# Patient Record
Sex: Male | Born: 1965 | Race: White | Hispanic: No | Marital: Single | Smoking: Never smoker
Health system: Southern US, Community
[De-identification: ages and names within clinical notes are randomized; demographics above are authoritative.]

## PROBLEM LIST (undated history)

## (undated) DIAGNOSIS — Z9889 Other specified postprocedural states: Secondary | ICD-10-CM

## (undated) DIAGNOSIS — I10 Essential (primary) hypertension: Secondary | ICD-10-CM

## (undated) DIAGNOSIS — R011 Cardiac murmur, unspecified: Secondary | ICD-10-CM

## (undated) DIAGNOSIS — J4530 Mild persistent asthma, uncomplicated: Secondary | ICD-10-CM

## (undated) DIAGNOSIS — F419 Anxiety disorder, unspecified: Secondary | ICD-10-CM

## (undated) DIAGNOSIS — Q249 Congenital malformation of heart, unspecified: Secondary | ICD-10-CM

## (undated) DIAGNOSIS — D689 Coagulation defect, unspecified: Secondary | ICD-10-CM

## (undated) DIAGNOSIS — G4700 Insomnia, unspecified: Secondary | ICD-10-CM

## (undated) HISTORY — PX: BACK SURGERY: SHX140

## (undated) HISTORY — DX: Insomnia, unspecified: G47.00

## (undated) HISTORY — DX: Mild persistent asthma, uncomplicated: J45.30

## (undated) HISTORY — PX: DENTAL RESTORATION/EXTRACTION WITH X-RAY: SHX5796

## (undated) HISTORY — DX: Anxiety disorder, unspecified: F41.9

## (undated) HISTORY — PX: ARTERY REPAIR: SHX559

## (undated) HISTORY — DX: Coagulation defect, unspecified: D68.9

## (undated) HISTORY — DX: Congenital malformation of heart, unspecified: Q24.9

## (undated) HISTORY — DX: Cardiac murmur, unspecified: R01.1

## (undated) HISTORY — DX: Essential (primary) hypertension: I10

## (undated) HISTORY — PX: SPINE SURGERY: SHX786

---

## 2001-03-30 ENCOUNTER — Emergency Department (HOSPITAL_COMMUNITY): Admission: EM | Admit: 2001-03-30 | Discharge: 2001-03-30 | Payer: Self-pay | Admitting: Emergency Medicine

## 2001-03-30 ENCOUNTER — Encounter: Payer: Self-pay | Admitting: Emergency Medicine

## 2002-08-03 ENCOUNTER — Emergency Department (HOSPITAL_COMMUNITY): Admission: EM | Admit: 2002-08-03 | Discharge: 2002-08-03 | Payer: Self-pay | Admitting: Emergency Medicine

## 2003-11-05 ENCOUNTER — Emergency Department (HOSPITAL_COMMUNITY): Admission: EM | Admit: 2003-11-05 | Discharge: 2003-11-05 | Payer: Self-pay | Admitting: Emergency Medicine

## 2003-11-11 ENCOUNTER — Emergency Department (HOSPITAL_COMMUNITY): Admission: EM | Admit: 2003-11-11 | Discharge: 2003-11-11 | Payer: Self-pay | Admitting: Emergency Medicine

## 2003-11-24 ENCOUNTER — Emergency Department (HOSPITAL_COMMUNITY): Admission: EM | Admit: 2003-11-24 | Discharge: 2003-11-24 | Payer: Self-pay | Admitting: Emergency Medicine

## 2005-08-28 ENCOUNTER — Inpatient Hospital Stay (HOSPITAL_COMMUNITY): Admission: RE | Admit: 2005-08-28 | Discharge: 2005-09-02 | Payer: Self-pay | Admitting: Psychiatry

## 2005-08-29 ENCOUNTER — Ambulatory Visit: Payer: Self-pay | Admitting: Psychiatry

## 2005-11-08 ENCOUNTER — Emergency Department (HOSPITAL_COMMUNITY): Admission: EM | Admit: 2005-11-08 | Discharge: 2005-11-08 | Payer: Self-pay | Admitting: Emergency Medicine

## 2007-02-02 ENCOUNTER — Ambulatory Visit: Payer: Self-pay | Admitting: Internal Medicine

## 2007-02-08 ENCOUNTER — Encounter: Admission: RE | Admit: 2007-02-08 | Discharge: 2007-02-09 | Payer: Self-pay | Admitting: Family Medicine

## 2007-02-09 ENCOUNTER — Encounter (INDEPENDENT_AMBULATORY_CARE_PROVIDER_SITE_OTHER): Payer: Self-pay | Admitting: Internal Medicine

## 2007-02-09 ENCOUNTER — Ambulatory Visit: Payer: Self-pay | Admitting: Family Medicine

## 2007-02-09 LAB — CONVERTED CEMR LAB
BUN: 23 mg/dL (ref 6–23)
Basophils Relative: 1 % (ref 0–1)
Chloride: 100 meq/L (ref 96–112)
Eosinophils Absolute: 0.2 10*3/uL (ref 0.2–0.7)
Eosinophils Relative: 3 % (ref 0–5)
Glucose, Bld: 120 mg/dL — ABNORMAL HIGH (ref 70–99)
HDL: 35 mg/dL — ABNORMAL LOW (ref >39)
Hemoglobin: 16.6 g/dL (ref 13.0–17.0)
Lymphocytes Relative: 33 % (ref 12–46)
Lymphs Abs: 2.1 10*3/uL (ref 0.7–4.0)
MCV: 89.3 fL (ref 78.0–100.0)
Monocytes Absolute: 0.5 10*3/uL (ref 0.1–1.0)
Monocytes Relative: 7 % (ref 3–12)
Neutro Abs: 3.7 10*3/uL (ref 1.7–7.7)
Neutrophils Relative %: 57 % (ref 43–77)
Platelets: 176 10*3/uL (ref 150–400)
Potassium: 4.4 meq/L (ref 3.5–5.3)
RBC: 5.51 M/uL (ref 4.22–5.81)
Total Bilirubin: 0.8 mg/dL (ref 0.3–1.2)
Total Protein: 7.8 g/dL (ref 6.0–8.3)
WBC: 6.5 10*3/uL (ref 4.0–10.5)

## 2007-04-06 ENCOUNTER — Encounter (INDEPENDENT_AMBULATORY_CARE_PROVIDER_SITE_OTHER): Payer: Self-pay | Admitting: Internal Medicine

## 2007-04-06 LAB — CONVERTED CEMR LAB
BUN: 14 mg/dL (ref 6–23)
CO2: 24 meq/L (ref 19–32)
Chloride: 106 meq/L (ref 96–112)

## 2007-06-15 ENCOUNTER — Ambulatory Visit: Payer: Self-pay | Admitting: Internal Medicine

## 2007-06-15 ENCOUNTER — Ambulatory Visit: Payer: Self-pay | Admitting: *Deleted

## 2007-06-15 ENCOUNTER — Encounter: Payer: Self-pay | Admitting: Family Medicine

## 2007-06-15 LAB — CONVERTED CEMR LAB
CO2: 24 meq/L (ref 19–32)
Calcium: 10.7 mg/dL — ABNORMAL HIGH (ref 8.4–10.5)
Chloride: 103 meq/L (ref 96–112)
HDL: 39 mg/dL — ABNORMAL LOW (ref >39)

## 2007-07-05 ENCOUNTER — Ambulatory Visit: Payer: Self-pay | Admitting: Internal Medicine

## 2007-07-13 ENCOUNTER — Ambulatory Visit: Payer: Self-pay | Admitting: Internal Medicine

## 2010-04-08 ENCOUNTER — Emergency Department (HOSPITAL_COMMUNITY)
Admission: EM | Admit: 2010-04-08 | Discharge: 2010-04-08 | Disposition: A | Discharge status: Home / Self Care | Payer: Self-pay | Attending: Emergency Medicine | Admitting: Emergency Medicine

## 2010-04-08 DIAGNOSIS — K029 Dental caries, unspecified: Secondary | ICD-10-CM | POA: Insufficient documentation

## 2010-04-08 DIAGNOSIS — K089 Disorder of teeth and supporting structures, unspecified: Secondary | ICD-10-CM | POA: Insufficient documentation

## 2010-05-31 ENCOUNTER — Emergency Department (HOSPITAL_COMMUNITY)
Admission: EM | Admit: 2010-05-31 | Discharge: 2010-05-31 | Disposition: A | Discharge status: Home / Self Care | Payer: Self-pay | Attending: Emergency Medicine | Admitting: Emergency Medicine

## 2010-05-31 ENCOUNTER — Emergency Department (HOSPITAL_COMMUNITY): Payer: Self-pay

## 2010-05-31 DIAGNOSIS — Z9889 Other specified postprocedural states: Secondary | ICD-10-CM | POA: Insufficient documentation

## 2010-05-31 DIAGNOSIS — R1310 Dysphagia, unspecified: Secondary | ICD-10-CM | POA: Insufficient documentation

## 2010-05-31 DIAGNOSIS — K029 Dental caries, unspecified: Secondary | ICD-10-CM | POA: Insufficient documentation

## 2010-05-31 DIAGNOSIS — R1012 Left upper quadrant pain: Secondary | ICD-10-CM | POA: Insufficient documentation

## 2010-05-31 DIAGNOSIS — R10819 Abdominal tenderness, unspecified site: Secondary | ICD-10-CM | POA: Insufficient documentation

## 2010-05-31 DIAGNOSIS — R079 Chest pain, unspecified: Secondary | ICD-10-CM | POA: Insufficient documentation

## 2010-05-31 LAB — URINALYSIS, ROUTINE W REFLEX MICROSCOPIC
Glucose, UA: NEGATIVE mg/dL
Hgb urine dipstick: NEGATIVE
Nitrite: NEGATIVE

## 2010-05-31 LAB — COMPREHENSIVE METABOLIC PANEL
Alkaline Phosphatase: 46 U/L (ref 39–117)
Calcium: 9.5 mg/dL (ref 8.4–10.5)
Chloride: 104 mEq/L (ref 96–112)
GFR calc Af Amer: >60 mL/min (ref >60)
GFR calc non Af Amer: >60 mL/min (ref >60)
Sodium: 139 mEq/L (ref 135–145)
Total Protein: 6.7 g/dL (ref 6.0–8.3)

## 2010-05-31 LAB — POCT CARDIAC MARKERS: Myoglobin, poc: 61.8 ng/mL (ref 12–200)

## 2010-05-31 LAB — DIFFERENTIAL
Basophils Absolute: 0 10*3/uL (ref 0.0–0.1)
Basophils Relative: 0 % (ref 0–1)
Eosinophils Absolute: 0.2 10*3/uL (ref 0.0–0.7)
Eosinophils Relative: 3 % (ref 0–5)
Lymphocytes Relative: 30 % (ref 12–46)
Monocytes Relative: 12 % (ref 3–12)
Neutrophils Relative %: 55 % (ref 43–77)

## 2010-05-31 LAB — CBC
HCT: 40.8 % (ref 39.0–52.0)
Hemoglobin: 14.7 g/dL (ref 13.0–17.0)
MCH: 30.4 pg (ref 26.0–34.0)
MCHC: 36 g/dL (ref 30.0–36.0)
Platelets: 146 10*3/uL — ABNORMAL LOW (ref 150–400)
RBC: 4.83 MIL/uL (ref 4.22–5.81)
WBC: 5.8 10*3/uL (ref 4.0–10.5)

## 2010-05-31 LAB — OCCULT BLOOD, POC DEVICE: Fecal Occult Bld: NEGATIVE

## 2010-07-11 NOTE — Discharge Summary (Signed)
NAME:  Jacob Mcmillan, Jacob Mcmillan NO.:  [id number]   MEDICAL RECORD NO.:  [id number]          PATIENT TYPE:  IPS   LOCATION:  0303                          FACILITY:  [facility]   PHYSICIAN:  Geoffery Lyons, M.D.      DATE OF BIRTH:  [birth year]   DATE OF ADMISSION:  08/28/2005  DATE OF DISCHARGE:  09/02/2005                                 DISCHARGE SUMMARY   CHIEF COMPLAINT AND PRESENT ILLNESS:  This was the first admission to [facility] for this 45 year old single white male who presented  to the emergency department requesting detox from drugs and alcohol.  The  urine drug screen was positive for cocaine as well as marijuana.  Endorsed  that he had gotten out of control with his drug use, used to just smoke  marijuana.  Since the mother of his children has not been allowing him to  see his daughter, he started abusing alcohol as well as crack.  Endorsed  depression, feeling hopeless, helpless.  Finds himself sitting and staring.   PAST PSYCHIATRIC HISTORY:  No previous treatment.   ALCOHOL/DRUG HISTORY:  As already stated, persistent use of cocaine,  marijuana as well as alcohol.   MEDICAL HISTORY:  Noncontributory.   MEDICATIONS:  None.   PHYSICAL EXAMINATION:  Performed and failed to show any acute findings.   LABORATORY DATA:  CBC with white blood cells 7.0, hemoglobin 14.5.  Blood  chemistry with sodium 142, potassium 3.7, glucose 142.  Liver enzymes with  SGOT 19, SGPT 20, total bilirubin 0.7, hemoglobin A1C 5.1, TSH 1.018.   MENTAL STATUS EXAM:  Alert, cooperative male.  Casually dressed.  Appropriately groomed and nourished.  Speech was not pressured.  Normal  rate, tempo and production.  Mood depressed and anxious.  Thought processes  are clear, rational and goal-oriented.  Would like to see his children.  Afraid, if wife knows of his drug use, that will give her more reasons for  him not to be able to see his children.  Denied suicidal or  homicidal  ideation.  No evidence of delusions.  No hallucinations.  Cognition was well-  preserved.   ADMISSION DIAGNOSES:  AXIS I:  Polysubstance dependence.  Depressive  disorder not otherwise specified.  AXIS II:  No diagnosis.  AXIS III:  No diagnosis.  AXIS IV:  Moderate.  AXIS V:  GAF upon admission 35-40; highest GAF in the last year 60.   HOSPITAL COURSE:  He was admitted.  He was started in individual and group  psychotherapy.  He was given Ambien for sleep.  He was given some Symmetrel.  He endorsed that he could not continue to live life the way he was living.  Working a job he does not like but he needs to keep, separated from his  common-law wife.  She moved out 45 minutes to an hour away.  Has not seen  his three children.  They do not want anything to do with him.  Upset.  Increased signs and symptoms of depression.  Since they have been gone,  admits to increased use of  substances.  Claimed that marijuana is his rehab  drugs, goes to marijuana as a better drug to calm down.  Some traumatic  events in his growing up.  Does endorse mood swings, anger, rage.  Not  related to his substance use.  On August 30, 2005, he continued to have a hard  time.  We pursued Depakote.  Very upset when thinking of his children.  Labile affect.  On August 31, 2005, he was still having a hard time with his  mood, very depressed.  We added Wellbutrin.  He was looking into the  possibility of residential programs.  Wanted to pursue a long-term one.  On  September 02, 2005, he was in full contact with reality.  He was committed to  abstinence.  Endorsed no suicidal or homicidal ideation.  No hallucinations.  No delusions.  No active withdrawal.  Was going to a residential treatment  program.  He was committed to get his life back together.   DISCHARGE DIAGNOSES:  AXIS I:  Mood disorder not otherwise specified.  Polysubstance dependence.  AXIS II:  No diagnosis.  AXIS III:  No diagnosis.  AXIS IV:   Moderate.  AXIS V:  GAF upon discharge 50.   DISCHARGE MEDICATIONS:  1. Symmetrel 100 mg twice a day.  2. Depakote ER 250 mg in the morning, 500 mg at bedtime.  3. Wellbutrin XL 150 mg in the morning.   FOLLOWUP:  To pursue treatment through [facility] in [place], [place].      Geoffery Lyons, M.D.  Electronically Signed     IL/MEDQ  D:  09/18/2005  T:  09/19/2005  Job:  [id number]

## 2010-07-11 NOTE — H&P (Signed)
NAME:  Jacob Mcmillan, Jacob Mcmillan NO.:  [id number]   MEDICAL RECORD NO.:  [id number]          PATIENT TYPE:  IPS   LOCATION:  0303                          FACILITY:  [facility]   PHYSICIAN:  Geoffery Lyons, M.D.      DATE OF BIRTH:  04/10/1965   DATE OF ADMISSION:  08/28/2005  DATE OF DISCHARGE:                         PSYCHIATRIC ADMISSION ASSESSMENT   IDENTIFYING INFORMATION:  This is a 45 year old, single, white male who  presented to the emergency department at [facility] yesterday  requesting detox from drugs and alcohol.  In the emergency department, his  urine was positive for cocaine as well as marijuana.  He was not noted to  have any alcohol in his blood at that time and his glucose was elevated at  152.  He stated that he realizes he has gotten out of control with drug  usage.  He used to just smoke marijuana, however, since the mother of his  children has not been allowing him to see his daughters, he has started  abusing alcohol as well as the crack.  He states that he also wants  treatment for depression.  He feels hopeless.  He finds himself sitting and  starring.  He states, I need something worthwhile.  I have nothing to look  forward to.  And, he also states that his mind races.  A mood disorders  questionnaire was administered.  It was remarkably positive and suggestive  for underlying mood disorder.   PAST PSYCHIATRIC HISTORY:  The patient has none.   SOCIAL HISTORY:  He completed the 9th grade.  He is employed Production designer, theatre/television/film.  He has never married.  He has 3 daughters, ages 30, 64, and 48.  They all have the same mother.   FAMILY HISTORY:  He states his father used alcohol.  His sister has  depression.   ALCOHOL AND DRUG HISTORY:  He began using marijuana at age 42; alcohol at  age 63; and crack cocaine at age 54.   PRIMARY CARE [name]:  He does not have any.   MEDICAL PROBLEMS:  1.  He is not aware of any.  2.  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  3.  He is also status post an L5 diskectomy at age 38.  4.  He has also had a graft in his left leg.   He is not prescribed any medications.   DRUG ALLERGIES:  CODEINE.  He has a rash and it also promotes nausea and  vomiting.   PHYSICAL EXAMINATION:  GENERAL:  Reveals a well-developed, well-nourished,  white male who appears his stated age of 21.  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  VITAL SIGNS:  On admission, show that his weight was 155 pounds.  His  temperature is 98.6.  He is 5 foot 10 inches.  Blood pressure is 132/67.  Pulse is 83.  Respirations are 17.  NEUROLOGIC:  Some slight shaking of his hands and that is due to his  detoxing.  The remainder of his physical exam was unremarkable.   MENTAL STATUS EXAMINATION:  He is alert and oriented x3.  He is casually  dressed, appropriately groomed and nourished.  His speech was not pressured.  His mood is depressed and anxious as is his affect, however, is appropriate  to the situation.  His thought processes are clear, rational, and goal-  oriented.  He would like to be able to see his children, but he is afraid if  the mother finds out of his drug usage that will just be more ammunition for  her to refuse to let him see the girls.  Concentration and memory are  intact.  Intelligence is at least average.  He specifically denies suicidal  or homicidal ideation.  He denies auditory visual hallucinations.   DIAGNOSES:   AXIS I:  Polysubstance abuse.  Major depressive disorder, rule out bipolar - depressed.   AXIS II:  No diagnosis.   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  Status post L5 diskectomy.  He has a graft in his left leg.   AXIS IV:  Severe, problems with primary support group.  He is not allowed to  see his children.   AXIS V:  Is 41.   PLAN:  1.  Admit for  detox.  2.  Start appropriate meds for depression.  3.  Assess for elevated glucose.  Toward that end, we will run a fasting      blood sugar, hemoglobin A1c, and check his CBG a.c. and h.s.      Vic Ripper, P.A.-C.      Geoffery Lyons, M.D.  Electronically Signed    MD/MEDQ  D:  08/29/2005  T:  08/29/2005  Job:  [id number]

## 2010-08-06 ENCOUNTER — Ambulatory Visit (HOSPITAL_COMMUNITY): Payer: Self-pay | Attending: Family Medicine

## 2014-01-28 ENCOUNTER — Emergency Department (HOSPITAL_COMMUNITY): Payer: Self-pay

## 2014-01-28 ENCOUNTER — Encounter (HOSPITAL_COMMUNITY): Payer: Self-pay | Admitting: Emergency Medicine

## 2014-01-28 ENCOUNTER — Emergency Department (HOSPITAL_COMMUNITY)
Admission: EM | Admit: 2014-01-28 | Discharge: 2014-01-28 | Disposition: A | Discharge status: Home / Self Care | Payer: Self-pay | Attending: Emergency Medicine | Admitting: Emergency Medicine

## 2014-01-28 DIAGNOSIS — K0889 Other specified disorders of teeth and supporting structures: Secondary | ICD-10-CM

## 2014-01-28 DIAGNOSIS — Z9889 Other specified postprocedural states: Secondary | ICD-10-CM | POA: Insufficient documentation

## 2014-01-28 DIAGNOSIS — K5900 Constipation, unspecified: Secondary | ICD-10-CM | POA: Insufficient documentation

## 2014-01-28 DIAGNOSIS — K088 Other specified disorders of teeth and supporting structures: Secondary | ICD-10-CM | POA: Insufficient documentation

## 2014-01-28 DIAGNOSIS — Z880 Allergy status to penicillin: Secondary | ICD-10-CM | POA: Insufficient documentation

## 2014-01-28 HISTORY — DX: Other specified postprocedural states: Z98.890

## 2014-01-28 LAB — CBC WITH DIFFERENTIAL/PLATELET
BASOS ABS: 0 10*3/uL (ref 0.0–0.1)
Basophils Relative: 0 % (ref 0–1)
Eosinophils Absolute: 0.2 10*3/uL (ref 0.0–0.7)
Eosinophils Relative: 2 % (ref 0–5)
HEMATOCRIT: 43.3 % (ref 39.0–52.0)
HEMOGLOBIN: 14.9 g/dL (ref 13.0–17.0)
Lymphocytes Relative: 22 % (ref 12–46)
Lymphs Abs: 1.6 10*3/uL (ref 0.7–4.0)
MCH: 29.9 pg (ref 26.0–34.0)
MCHC: 34.4 g/dL (ref 30.0–36.0)
MCV: 86.9 fL (ref 78.0–100.0)
MONO ABS: 0.9 10*3/uL (ref 0.1–1.0)
MONOS PCT: 12 % (ref 3–12)
Neutro Abs: 4.7 10*3/uL (ref 1.7–7.7)
Neutrophils Relative %: 64 % (ref 43–77)
Platelets: 157 10*3/uL (ref 150–400)
RBC: 4.98 MIL/uL (ref 4.22–5.81)
RDW: 12.9 % (ref 11.5–15.5)
WBC: 7.4 10*3/uL (ref 4.0–10.5)

## 2014-01-28 LAB — BASIC METABOLIC PANEL
Anion gap: 15 (ref 5–15)
BUN: 22 mg/dL (ref 6–23)
CHLORIDE: 104 meq/L (ref 96–112)
CO2: 22 meq/L (ref 19–32)
Calcium: 9.6 mg/dL (ref 8.4–10.5)
Creatinine, Ser: 0.9 mg/dL (ref 0.50–1.35)
GFR calc non Af Amer: >90 mL/min (ref >90)
Glucose, Bld: 95 mg/dL (ref 70–99)
POTASSIUM: 4.2 meq/L (ref 3.7–5.3)
Sodium: 141 mEq/L (ref 137–147)

## 2014-01-28 MED ORDER — PENICILLIN V POTASSIUM 500 MG PO TABS: 1000.0000 mg | ORAL_TABLET | Freq: Two times a day (BID) | ORAL | Status: DC

## 2014-01-28 NOTE — Discharge Instructions (Signed)
Constipation °Constipation is when a person has fewer than three bowel movements a week, has difficulty having a bowel movement, or has stools that are dry, hard, or larger than normal. As people grow older, constipation is more common. If you try to fix constipation with medicines that make you have a bowel movement (laxatives), the problem may get worse. Long-term laxative use may cause the muscles of the colon to become weak. A low-fiber diet, not taking in enough fluids, and taking certain medicines may make constipation worse.  °CAUSES  °· Certain medicines, such as antidepressants, pain medicine, iron supplements, antacids, and water pills.   °· Certain diseases, such as diabetes, irritable bowel syndrome (IBS), thyroid disease, or depression.   °· Not drinking enough water.   °· Not eating enough fiber-rich foods.   °· Stress or travel.   °· Lack of physical activity or exercise.   °· Ignoring the urge to have a bowel movement.   °· Using laxatives too much.   °SIGNS AND SYMPTOMS  °· Having fewer than three bowel movements a week.   °· Straining to have a bowel movement.   °· Having stools that are hard, dry, or larger than normal.   °· Feeling full or bloated.   °· Pain in the lower abdomen.   °· Not feeling relief after having a bowel movement.   °DIAGNOSIS  °Your health care provider will take a medical history and perform a physical exam. Further testing may be done for severe constipation. Some tests may include: °· A barium enema X-ray to examine your rectum, colon, and, sometimes, your small intestine.   °· A sigmoidoscopy to examine your lower colon.   °· A colonoscopy to examine your entire colon. °TREATMENT  °Treatment will depend on the severity of your constipation and what is causing it. Some dietary treatments include drinking more fluids and eating more fiber-rich foods. Lifestyle treatments may include regular exercise. If these diet and lifestyle recommendations do not help, your health care  provider may recommend taking over-the-counter laxative medicines to help you have bowel movements. Prescription medicines may be prescribed if over-the-counter medicines do not work.  °HOME CARE INSTRUCTIONS  °· Eat foods that have a lot of fiber, such as fruits, vegetables, whole grains, and beans. °· Limit foods high in fat and processed sugars, such as french fries, hamburgers, cookies, candies, and soda.   °· A fiber supplement may be added to your diet if you cannot get enough fiber from foods.   °· Drink enough fluids to keep your urine clear or pale yellow.   °· Exercise regularly or as directed by your health care provider.   °· Go to the restroom when you have the urge to go. Do not hold it.   °· Only take over-the-counter or prescription medicines as directed by your health care provider. Do not take other medicines for constipation without talking to your health care provider first.   °SEEK IMMEDIATE MEDICAL CARE IF:  °· You have bright red blood in your stool.   °· Your constipation lasts for more than 4 days or gets worse.   °· You have abdominal or rectal pain.   °· You have thin, pencil-like stools.   °· You have unexplained weight loss. °MAKE SURE YOU:  °· Understand these instructions. °· Will watch your condition. °· Will get help right away if you are not doing well or get worse. °Document Released: 11/08/2003 Document Revised: 02/14/2013 Document Reviewed: 11/21/2012 °ExitCare® Patient Information ©2015 [employer]. This information is not intended to replace advice given to you by your health care provider. Make sure you discuss any questions   you have with your health care provider.      Bloating Bloating is the feeling of fullness in your belly. You may feel as though your pants are too tight. Often the cause of bloating is overeating, retaining fluids, or having gas in your bowel. It is also caused by swallowing air and eating foods that cause gas. Irritable bowel syndrome is one of  the most common causes of bloating. Constipation is also a common cause. Sometimes more serious problems can cause bloating. SYMPTOMS  Usually there is a feeling of fullness, as though your abdomen is bulged out. There may be mild discomfort.  DIAGNOSIS  Usually no particular testing is necessary for most bloating. If the condition persists and seems to become worse, your caregiver may do additional testing.  TREATMENT   There is no direct treatment for bloating.  Do not put gas into the bowel. Avoid chewing gum and sucking on candy. These tend to make you swallow air. Swallowing air can also be a nervous habit. Try to avoid this.  Avoiding high residue diets will help. Eat foods with soluble fibers (examples include root vegetables, apples, or barley) and substitute dairy products with soy and rice products. This helps irritable bowel syndrome.  If constipation is the cause, then a high residue diet with more fiber will help.  Avoid carbonated beverages.  Over-the-counter preparations are available that help reduce gas. Your pharmacist can help you with this. SEEK MEDICAL CARE IF:   Bloating continues and seems to be getting worse.  You notice a weight gain.  You have a weight loss but the bloating is getting worse.  You have changes in your bowel habits or develop nausea or vomiting. SEEK IMMEDIATE MEDICAL CARE IF:   You develop shortness of breath or swelling in your legs.  You have an increase in abdominal pain or develop chest pain. Document Released: 12/10/2005 Document Revised: 05/04/2011 Document Reviewed: 01/28/2007 [facility] LLCExitCare Patient Information 2015 [place], [place]. This information is not intended to replace advice given to you by your health care provider. Make sure you discuss any questions you have with your health care provider.    Dental Pain A tooth ache may be caused by cavities (tooth decay). Cavities expose the nerve of the tooth to air and hot or cold  temperatures. It may come from an infection or abscess (also called a boil or furuncle) around your tooth. It is also often caused by dental caries (tooth decay). This causes the pain you are having. DIAGNOSIS  Your caregiver can diagnose this problem by exam. TREATMENT   If caused by an infection, it may be treated with medications which kill germs (antibiotics) and pain medications as prescribed by your caregiver. Take medications as directed.  Only take over-the-counter or prescription medicines for pain, discomfort, or fever as directed by your caregiver.  Whether the tooth ache today is caused by infection or dental disease, you should see your dentist as soon as possible for further care. SEEK MEDICAL CARE IF: The exam and treatment you received today has been provided on an emergency basis only. This is not a substitute for complete medical or dental care. If your problem worsens or new problems (symptoms) appear, and you are unable to meet with your dentist, call or return to this location. SEEK IMMEDIATE MEDICAL CARE IF:   You have a fever.  You develop redness and swelling of your face, jaw, or neck.  You are unable to open your mouth.  You have  severe pain uncontrolled by pain medicine. MAKE SURE YOU:   Understand these instructions.  Will watch your condition.  Will get help right away if you are not doing well or get worse. Document Released: 02/09/2005 Document Revised: 05/04/2011 Document Reviewed: 09/28/2007 [facility] Patient Information 2015 [place], [place]. This information is not intended to replace advice given to you by your health care provider. Make sure you discuss any questions you have with your health care provider.

## 2014-01-28 NOTE — ED Notes (Signed)
Pt c/o constipation x 7 days.  States that he hasn't had any bowel movements since then but then stated that he has been using laxatives and has been having diarrhea x 3 days.  Pt also states that he has dental pain that "may be related" to the constipation.

## 2014-01-28 NOTE — ED Provider Notes (Addendum)
CSN: [id number]     Arrival date & time 01/28/14  [id number] History   First MD Initiated Contact with Patient 01/28/14 0857     Chief Complaint  Patient presents with  . Constipation  . Diarrhea  . Dental Pain     (Consider location/radiation/quality/duration/timing/severity/associated sxs/prior Treatment) HPI  48 year old male presents with a chief complaint of constipation for the past 1-2 weeks. States he has not had a normal bowel movement for the past 2 weeks. He has tried Dulcolax, suppository, improving juice. He's had 3 "fluid" bowel movements. Patient feels like he still is constipated and feels his abdomen is distended on the left side. Occasionally he feels nauseous but has not vomited. He has not taken any medicines for pain or any narcotics. He's never had constipation like this before. Denies any fevers. Patient also is wondering if his right incisor is infected it is one exists contributing. Patient states that for the past couple days he been having pain and swelling over his right mandibular incisor. Does not have enough money to go to a dentist.  Past Medical History  Diagnosis Date  . History of open heart surgery    Past Surgical History  Procedure Laterality Date  . Back surgery     History reviewed. No pertinent family history. History  Substance Use Topics  . Smoking status: Never Smoker   . Smokeless tobacco: Not on file  . Alcohol Use: No    Review of Systems  Constitutional: Negative for fever.  HENT: Positive for dental problem.   Gastrointestinal: Positive for nausea, abdominal pain, diarrhea, constipation and abdominal distention.  All other systems reviewed and are negative.     Allergies  Amoxicillin and Codeine  Home Medications   Prior to Admission medications   Medication Sig Start Date End Date Taking? Authorizing Provider  penicillin v potassium (VEETID) 500 MG tablet Take 2 tablets (1,000 mg total) by mouth 2 (two) times daily. X 7 days  01/28/14   Audree CamelScott T Eutimio Gharibian, MD   BP 106/80 mmHg  Pulse 82  Temp(Src) 98.6 F (37 C) (Oral)  Resp 16  SpO2 95% Physical Exam  Constitutional: He is oriented to person, place, and time. He appears well-developed and well-nourished.  HENT:  Head: Normocephalic and atraumatic.  Right Ear: External ear normal.  Left Ear: External ear normal.  Nose: Nose normal.  Mouth/Throat: Oropharynx is clear and moist.    Overall poor dentition  Eyes: Right eye exhibits no discharge. Left eye exhibits no discharge.  Neck: Neck supple.  Cardiovascular: Normal rate, regular rhythm, normal heart sounds and intact distal pulses.   Pulmonary/Chest: Effort normal and breath sounds normal.  Abdominal: Soft. He exhibits no distension. There is no tenderness.  Genitourinary: Rectal exam shows no external hemorrhoid, no internal hemorrhoid, no mass and no tenderness.  No stool in rectal vault  Neurological: He is alert and oriented to person, place, and time.  Skin: Skin is warm and dry.  Nursing note and vitals reviewed.   ED Course  Procedures (including critical care time) Labs Review Labs Reviewed  CBC WITH DIFFERENTIAL  BASIC METABOLIC PANEL    Imaging Review Dg Abd Acute W/chest  01/28/2014   CLINICAL DATA:  Left upper quadrant acute abdominal pain for 1 week with nausea and constipation.  EXAM: ACUTE ABDOMEN SERIES (ABDOMEN 2 VIEW & CHEST 1 VIEW)  COMPARISON:  05/31/2010, 02/13/2009  FINDINGS: Remote median sternotomy. Sternotomy wires are broken as before. Normal heart size and vascularity. Lungs  remain clear. No pneumonia, collapse or consolidation. Negative for edema, effusion or pneumothorax. Trachea midline. No free air evident. Scattered air and stool throughout the bowel. No significant dilatation or obstruction patent. Degenerative changes and mild levoscoliosis of the spine. No abnormal calcifications or acute osseous finding.  IMPRESSION: No acute chest process.  Negative for bowel  obstruction or free air.   Electronically Signed   By: Ruel Favorsrevor  Shick M.D.   On: 01/28/2014 10:24     EKG Interpretation None      MDM   Final diagnoses:  Pain, dental  Constipation, unspecified constipation type    Patient has no evidence of impaction or obstruction. Labwork unremarkable, no significant electrolyte disturbance to cause his symptoms. Has chronic dental problems, possibly new developing abscess/infection now. No airway issues and no swelling. Doubt deep space infection. Is requesting penicillin for antibiotics. He has listed amoxicillin as an allergy for swelling one time after taking. He states he has taken penicillin since then without trouble, many times before and after the amoxicillin. I discussed possible allergic risks but he tells me this has not been a problem multiple times before and this is the only thing that helps his dental infections.   Audree CamelScott T Vastie Douty, MD 01/28/14 1149  Audree CamelScott T Correy Weidner, MD 01/28/14 1149

## 2014-01-28 NOTE — ED Notes (Signed)
MD at bedside. 

## 2014-01-28 NOTE — ED Notes (Signed)
Patient transported to X-ray 

## 2014-12-07 ENCOUNTER — Ambulatory Visit (INDEPENDENT_AMBULATORY_CARE_PROVIDER_SITE_OTHER): Payer: Managed Care, Other (non HMO)

## 2014-12-07 ENCOUNTER — Ambulatory Visit (INDEPENDENT_AMBULATORY_CARE_PROVIDER_SITE_OTHER): Payer: Managed Care, Other (non HMO) | Admitting: Emergency Medicine

## 2014-12-07 VITALS — BP 122/80 | HR 87 | Temp 98.4°F | Resp 16 | Ht 70.0 in | Wt 174.0 lb

## 2014-12-07 DIAGNOSIS — M25511 Pain in right shoulder: Secondary | ICD-10-CM | POA: Diagnosis not present

## 2014-12-07 DIAGNOSIS — M7551 Bursitis of right shoulder: Secondary | ICD-10-CM | POA: Diagnosis not present

## 2014-12-07 MED ORDER — NAPROXEN SODIUM 550 MG PO TABS: 550.0000 mg | ORAL_TABLET | Freq: Two times a day (BID) | ORAL | Status: AC

## 2014-12-07 MED ORDER — METHYLPREDNISOLONE ACETATE 80 MG/ML IJ SUSP: 80.0000 mg | Freq: Once | INTRAMUSCULAR | Status: DC

## 2014-12-07 NOTE — Patient Instructions (Signed)
Bursitis °Bursitis is inflammation and irritation of a bursa, which is one of the small, fluid-filled sacs that cushion and protect the moving parts of your body. These sacs are located between bones and muscles, muscle attachments, or skin areas next to bones. A bursa protects these structures from the wear and tear that results from frequent movement. °An inflamed bursa causes pain and swelling. Fluid may build up inside the sac. Bursitis is most common near joints, especially the knees, elbows, hips, and shoulders. °CAUSES °Bursitis can be caused by:  °· Injury from: °¨ A direct blow, like falling on your knee or elbow. °¨ Overuse of a joint (repetitive stress). °· Infection. This can happen if bacteria gets into a bursa through a cut or scrape near a joint. °· Diseases that cause joint inflammation, such as gout and rheumatoid arthritis. °RISK FACTORS °You may be at risk for bursitis if you:  °· Have a job or hobby that involves a lot of repetitive stress on your joints. °· Have a condition that weakens your body's defense system (immune system), such as diabetes, cancer, or HIV. °· Lift and reach overhead often. °· Kneel or lean on hard surfaces often. °· Run or walk often. °SIGNS AND SYMPTOMS °The most common signs and symptoms of bursitis are: °· Pain that gets worse when you move the affected body part or put weight on it. °· Inflammation. °· Stiffness. °Other signs and symptoms may include: °· Redness. °· Tenderness. °· Warmth. °· Pain that continues after rest. °· Fever and chills. This may occur in bursitis caused by infection. °DIAGNOSIS °Bursitis may be diagnosed by:  °· Medical history and physical exam. °· MRI. °· A procedure to drain fluid from the bursa with a needle (aspiration). The fluid may be checked for signs of infection or gout. °· Blood tests to rule out other causes of inflammation. °TREATMENT  °Bursitis can usually be treated at home with rest, ice, compression, and elevation (RICE). For  mild bursitis, RICE treatment may be all you need. Other treatments may include: °· Nonsteroidal anti-inflammatory drugs (NSAIDs) to treat pain and inflammation. °· Corticosteroids to fight inflammation. You may have these drugs injected into and around the area of bursitis. °· Aspiration of bursitis fluid to relieve pain and improve movement. °· Antibiotic medicine to treat an infected bursa. °· A splint, brace, or walking aid. °· Physical therapy if you continue to have pain or limited movement. °· Surgery to remove a damaged or infected bursa. This may be needed if you have a very bad case of bursitis or if other treatments have not worked. °HOME CARE INSTRUCTIONS  °· Take medicines only as directed by your health care provider. °· If you were prescribed an antibiotic medicine, finish it all even if you start to feel better. °· Rest the affected area as directed by your health care provider. °¨ Keep the area elevated. °¨ Avoid activities that make pain worse. °· Apply ice to the injured area: °¨ Place ice in a plastic bag. °¨ Place a towel between your skin and the bag. °¨ Leave the ice on for 20 minutes, 2-3 times a day. °· Use splints, braces, pads, or walking aids as directed by your health care provider. °· Keep all follow-up visits as directed by your health care provider. This is important. °PREVENTION  °· Wear knee pads if you kneel often. °· Wear sturdy running or walking shoes that fit you well. °· Take regular breaks from repetitive activity. °· Warm   up by stretching before doing any strenuous activity. °· Maintain a healthy weight or lose weight as recommended by your health care provider. Ask your health care provider if you need help. °· Exercise regularly. Start any new physical activity gradually. °SEEK MEDICAL CARE IF:  °· Your bursitis is not responding to treatment or home care. °· You have a fever. °· You have chills. °  °This information is not intended to replace advice given to you by your  health care provider. Make sure you discuss any questions you have with your health care provider. °  °Document Released: 02/07/2000 Document Revised: 10/31/2014 Document Reviewed: 05/01/2013 °Elsevier Interactive Patient Education ©2016 [employer]. ° °

## 2014-12-07 NOTE — Progress Notes (Signed)
Subjective:  Patient ID: Jacob Mcmillan, male    DOB: 01-11-1966  Age: 49 y.o. MRN: [id number]  CC: Shoulder Pain   HPI KIETH HARTIS presents   With pain in his right shoulder. He says he has a history of an before meals separation of the right shoulder in the remote past. He has now has and in his right shoulder at the   Right" acromioclavicular joint. History of injury or overuse. He also pain in his posterior shoulder. He is unable to raise his arm and claims he has a rotator cuff tear. He is done nothing for pain and has had pain for 3 days he works as a Administrator  History Agostino has a past medical history of History of open heart surgery; Clotting disorder (HCC); and Heart murmur.   He has past surgical history that includes Back surgery and Spine surgery.   His  family history includes Hyperlipidemia in his father; Hypertension in his father.  He   reports that he has never smoked. He has never used smokeless tobacco. He reports that he does not drink alcohol or use illicit drugs.  Outpatient Prescriptions Prior to Visit  Medication Sig Dispense Refill  . penicillin v potassium (VEETID) 500 MG tablet Take 2 tablets (1,000 mg total) by mouth 2 (two) times daily. X 7 days (Patient not taking: Reported on 12/07/2014) 28 tablet 0   No facility-administered medications prior to visit.    Social History   Social History  . Marital Status: Single    Spouse Name: N/A  . Number of Children: N/A  . Years of Education: N/A   Social History Main Topics  . Smoking status: Never Smoker   . Smokeless tobacco: Never Used  . Alcohol Use: No  . Drug Use: No  . Sexual Activity: Not Asked   Other Topics Concern  . None   Social History Narrative     Review of Systems  Constitutional: Negative for fever, chills and appetite change.  HENT: Negative for congestion, ear pain, postnasal drip, sinus pressure and sore throat.   Eyes: Negative for pain and redness.    Respiratory: Negative for cough, shortness of breath and wheezing.   Cardiovascular: Negative for leg swelling.  Gastrointestinal: Negative for nausea, vomiting, abdominal pain, diarrhea, constipation and blood in stool.  Endocrine: Negative for polyuria.  Genitourinary: Negative for dysuria, urgency, frequency and flank pain.  Musculoskeletal: Negative for gait problem.  Skin: Negative for rash.  Neurological: Negative for weakness and headaches.  Psychiatric/Behavioral: Negative for confusion and decreased concentration. The patient is not nervous/anxious.     Objective:  BP 122/80 mmHg  Pulse 87  Temp(Src) 98.4 F (36.9 C) (Oral)  Resp 16  Ht  (1.778 m)  Wt 174 lb (78.926 kg)  BMI 24.97 kg/m2  SpO2 98%  Physical Exam  Musculoskeletal:       Right shoulder: He exhibits decreased range of motion and tenderness. He exhibits no swelling, no effusion, no crepitus and no deformity.      Assessment & Plan:   Kayveon was seen today for shoulder pain.  Diagnoses and all orders for this visit:  Pain in joint of right shoulder -     DG Shoulder Right; Future  Shoulder bursitis, right -     methylPREDNISolone acetate (DEPO-MEDROL) injection 80 mg; Inject 1 mL (80 mg total) into the muscle once.  Other orders -     naproxen sodium (ANAPROX DS) 550 MG tablet; Take  1 tablet (550 mg total) by mouth 2 (two) times daily with a meal.   I have discontinued Mr. Norris CrossWilliams's penicillin v potassium. I am also having him start on naproxen sodium. We will continue to administer methylPREDNISolone acetate.  Meds ordered this encounter  Medications  . methylPREDNISolone acetate (DEPO-MEDROL) injection 80 mg    Sig:   . naproxen sodium (ANAPROX DS) 550 MG tablet    Sig: Take 1 tablet (550 mg total) by mouth 2 (two) times daily with a meal.    Dispense:  40 tablet    Refill:  0   His posterior shoulder bursa was injected with 40 of Depo-Medrol and 2 mL of Marcaine with relief of  symptoms  Appropriate red flag conditions were discussed with the patient as well as actions that should be taken.  Patient expressed his understanding.  Follow-up: Return if symptoms worsen or fail to improve.  Carmelina DaneAnderson, Georgie Eduardo S, MD   [facility] reading (PRIMARY) by  Dr. Dareen PianoAnderson negative.

## 2015-05-27 ENCOUNTER — Emergency Department (HOSPITAL_COMMUNITY): Payer: 59

## 2015-05-27 ENCOUNTER — Encounter (HOSPITAL_COMMUNITY): Payer: Self-pay | Admitting: Emergency Medicine

## 2015-05-27 ENCOUNTER — Emergency Department (HOSPITAL_COMMUNITY)
Admission: EM | Admit: 2015-05-27 | Discharge: 2015-05-27 | Disposition: A | Discharge status: Home / Self Care | Payer: 59 | Attending: Emergency Medicine | Admitting: Emergency Medicine

## 2015-05-27 DIAGNOSIS — S29011A Strain of muscle and tendon of front wall of thorax, initial encounter: Secondary | ICD-10-CM | POA: Diagnosis not present

## 2015-05-27 DIAGNOSIS — Z791 Long term (current) use of non-steroidal anti-inflammatories (NSAID): Secondary | ICD-10-CM | POA: Insufficient documentation

## 2015-05-27 DIAGNOSIS — S29001A Unspecified injury of muscle and tendon of front wall of thorax, initial encounter: Secondary | ICD-10-CM | POA: Diagnosis present

## 2015-05-27 DIAGNOSIS — X58XXXA Exposure to other specified factors, initial encounter: Secondary | ICD-10-CM | POA: Diagnosis not present

## 2015-05-27 DIAGNOSIS — R011 Cardiac murmur, unspecified: Secondary | ICD-10-CM | POA: Diagnosis not present

## 2015-05-27 DIAGNOSIS — Y9289 Other specified places as the place of occurrence of the external cause: Secondary | ICD-10-CM | POA: Diagnosis not present

## 2015-05-27 DIAGNOSIS — Z880 Allergy status to penicillin: Secondary | ICD-10-CM | POA: Insufficient documentation

## 2015-05-27 DIAGNOSIS — Z86718 Personal history of other venous thrombosis and embolism: Secondary | ICD-10-CM | POA: Insufficient documentation

## 2015-05-27 DIAGNOSIS — Y998 Other external cause status: Secondary | ICD-10-CM | POA: Insufficient documentation

## 2015-05-27 DIAGNOSIS — R110 Nausea: Secondary | ICD-10-CM | POA: Insufficient documentation

## 2015-05-27 DIAGNOSIS — Z862 Personal history of diseases of the blood and blood-forming organs and certain disorders involving the immune mechanism: Secondary | ICD-10-CM | POA: Insufficient documentation

## 2015-05-27 DIAGNOSIS — Y9389 Activity, other specified: Secondary | ICD-10-CM | POA: Diagnosis not present

## 2015-05-27 LAB — CBC
HCT: 41 % (ref 39.0–52.0)
HEMOGLOBIN: 13.9 g/dL (ref 13.0–17.0)
MCH: 28.1 pg (ref 26.0–34.0)
MCHC: 33.9 g/dL (ref 30.0–36.0)
MCV: 82.8 fL (ref 78.0–100.0)
Platelets: 184 10*3/uL (ref 150–400)
RBC: 4.95 MIL/uL (ref 4.22–5.81)
RDW: 13.1 % (ref 11.5–15.5)
WBC: 9.7 10*3/uL (ref 4.0–10.5)

## 2015-05-27 LAB — BASIC METABOLIC PANEL
ANION GAP: 10 (ref 5–15)
BUN: 17 mg/dL (ref 6–20)
CALCIUM: 10.3 mg/dL (ref 8.9–10.3)
CO2: 27 mmol/L (ref 22–32)
Chloride: 104 mmol/L (ref 101–111)
Creatinine, Ser: 1.05 mg/dL (ref 0.61–1.24)
Glucose, Bld: 102 mg/dL — ABNORMAL HIGH (ref 65–99)
Potassium: 4.4 mmol/L (ref 3.5–5.1)
SODIUM: 141 mmol/L (ref 135–145)

## 2015-05-27 LAB — D-DIMER, QUANTITATIVE (NOT AT ARMC): D DIMER QUANT: 0.39 ug{FEU}/mL (ref 0.00–0.50)

## 2015-05-27 LAB — I-STAT TROPONIN, ED: TROPONIN I, POC: 0 ng/mL (ref 0.00–0.08)

## 2015-05-27 MED ORDER — SODIUM CHLORIDE 0.9 % IV BOLUS (SEPSIS)
1000.0000 mL | Freq: Once | INTRAVENOUS | Status: AC
  Administered 2015-05-27: 1000 mL via INTRAVENOUS

## 2015-05-27 MED ORDER — ACETAMINOPHEN 500 MG PO TABS
1000.0000 mg | ORAL_TABLET | Freq: Once | ORAL | Status: DC
  Filled 2015-05-27: qty 2

## 2015-05-27 MED ORDER — IBUPROFEN 800 MG PO TABS
800.0000 mg | ORAL_TABLET | Freq: Once | ORAL | Status: DC
  Filled 2015-05-27: qty 1

## 2015-05-27 MED ORDER — OXYCODONE HCL 5 MG PO TABS
ORAL_TABLET | ORAL | Status: AC
  Filled 2015-05-27: qty 1

## 2015-05-27 MED ORDER — OXYCODONE HCL 5 MG PO TABS
5.0000 mg | ORAL_TABLET | Freq: Once | ORAL | Status: AC
  Administered 2015-05-27: 5 mg via ORAL
  Filled 2015-05-27: qty 1

## 2015-05-27 MED ORDER — GI COCKTAIL ~~LOC~~
30.0000 mL | Freq: Once | ORAL | Status: AC
  Administered 2015-05-27: 30 mL via ORAL
  Filled 2015-05-27: qty 30

## 2015-05-27 NOTE — ED Notes (Signed)
Discharge instructions and follow up care reviewed with patient. Patient verbalized understanding. 

## 2015-05-27 NOTE — Discharge Instructions (Signed)
Take 4 over the counter ibuprofen tablets 3 times a day or 2 over-the-counter naproxen tablets twice a day for pain. ° °Muscle Strain °A muscle strain is an injury that occurs when a muscle is stretched beyond its normal length. Usually a small number of muscle fibers are torn when this happens. Muscle strain is rated in degrees. First-degree strains have the least amount of muscle fiber tearing and pain. Second-degree and third-degree strains have increasingly more tearing and pain.  °Usually, recovery from muscle strain takes 1-2 weeks. Complete healing takes 5-6 weeks.  °CAUSES  °Muscle strain happens when a sudden, violent force placed on a muscle stretches it too far. This may occur with lifting, sports, or a fall.  °RISK FACTORS °Muscle strain is especially common in athletes.  °SIGNS AND SYMPTOMS °At the site of the muscle strain, there may be: °· Pain. °· Bruising. °· Swelling. °· Difficulty using the muscle due to pain or lack of normal function. °DIAGNOSIS  °Your health care provider will perform a physical exam and ask about your medical history. °TREATMENT  °Often, the best treatment for a muscle strain is resting, icing, and applying cold compresses to the injured area.   °HOME CARE INSTRUCTIONS  °· Use the PRICE method of treatment to promote muscle healing during the first 2-3 days after your injury. The PRICE method involves: °¨ Protecting the muscle from being injured again. °¨ Restricting your activity and resting the injured body part. °¨ Icing your injury. To do this, put ice in a plastic bag. Place a towel between your skin and the bag. Then, apply the ice and leave it on from 15-20 minutes each hour. After the third day, switch to moist heat packs. °¨ Apply compression to the injured area with a splint or elastic bandage. Be careful not to wrap it too tightly. This may interfere with blood circulation or increase swelling. °¨ Elevate the injured body part above the level of your heart as often  as you can. °· Only take over-the-counter or prescription medicines for pain, discomfort, or fever as directed by your health care provider. °· Warming up prior to exercise helps to prevent future muscle strains. °SEEK MEDICAL CARE IF:  °· You have increasing pain or swelling in the injured area. °· You have numbness, tingling, or a significant loss of strength in the injured area. °MAKE SURE YOU:  °· Understand these instructions. °· Will watch your condition. °· Will get help right away if you are not doing well or get worse. °  °This information is not intended to replace advice given to you by your health care provider. Make sure you discuss any questions you have with your health care provider. °  °Document Released: 02/09/2005 Document Revised: 11/30/2012 Document Reviewed: 09/08/2012 °Elsevier Interactive Patient Education ©2016 Elsevier Inc. ° °

## 2015-05-27 NOTE — ED Provider Notes (Signed)
CSN: [id number]     Arrival date & time 05/27/15  1422 History   First MD Initiated Contact with Patient 05/27/15 1641     Chief Complaint  Patient presents with  . Muscle Pain     (Consider location/radiation/quality/duration/timing/severity/associated sxs/prior Treatment) Patient is a 50 y.o. male presenting with musculoskeletal pain. The history is provided by the patient.  Muscle Pain This is a new problem. The current episode started less than 1 hour ago. The problem occurs constantly. The problem has not changed since onset.Associated symptoms include chest pain and shortness of breath. Pertinent negatives include no abdominal pain and no headaches. Nothing aggravates the symptoms. Nothing relieves the symptoms. He has tried nothing for the symptoms. The treatment provided no relief.   50 yo M With a chief complaint of left-sided chest pain. This is localized to the sternoclavicular joint. Patient has a small knot there. Is not sure how he got it. The pain is worse with movement palpation. He also has had a lot of belching with this. He's had open heart surgery in the past that he thinks was to repair of VSD. Patient denies any history of coronary disease. Denies fevers or chills. Has been having a cough as well. Has a history of multiple DVTs in the past. Patient denies history of PE.  Past Medical History  Diagnosis Date  . History of open heart surgery   . Clotting disorder (HCC)   . Heart murmur    Past Surgical History  Procedure Laterality Date  . Back surgery    . Spine surgery     Family History  Problem Relation Age of Onset  . Hypertension Father   . Hyperlipidemia Father    Social History  Substance Use Topics  . Smoking status: Never Smoker   . Smokeless tobacco: Never Used  . Alcohol Use: No    Review of Systems  Constitutional: Negative for fever and chills.  HENT: Negative for congestion and facial swelling.   Eyes: Negative for discharge and visual  disturbance.  Respiratory: Positive for shortness of breath.   Cardiovascular: Positive for chest pain. Negative for palpitations.  Gastrointestinal: Positive for nausea. Negative for vomiting, abdominal pain and diarrhea.       Belching   Musculoskeletal: Negative for myalgias and arthralgias.  Skin: Negative for color change and rash.  Neurological: Negative for tremors, syncope and headaches.  Psychiatric/Behavioral: Negative for confusion and dysphoric mood.      Allergies  Amoxicillin and Codeine  Home Medications   Prior to Admission medications   Medication Sig Start Date End Date Taking? Authorizing Provider  naproxen sodium (ANAPROX DS) 550 MG tablet Take 1 tablet (550 mg total) by mouth 2 (two) times daily with a meal. 12/07/14 12/07/15  Carmelina DaneJeffery S Anderson, MD   BP 133/75 mmHg  Pulse 105  Temp(Src) 98.8 F (37.1 C) (Oral)  Resp 24  SpO2 97% Physical Exam  Constitutional: He is oriented to person, place, and time. He appears well-developed and well-nourished.  HENT:  Head: Normocephalic and atraumatic.  Eyes: EOM are normal. Pupils are equal, round, and reactive to light.  Neck: Normal range of motion. Neck supple. No JVD present.  Cardiovascular: Normal rate and regular rhythm.  Exam reveals no gallop and no friction rub.   No murmur heard. Pulmonary/Chest: No respiratory distress. He has no wheezes.  Abdominal: He exhibits no distension. There is no tenderness. There is no rebound and no guarding.  Musculoskeletal: Normal range of motion. He  exhibits tenderness.  Tender palpation about the sternoclavicular joint. Tenderness about the pectoral muscle as well.  Neurological: He is alert and oriented to person, place, and time.  Skin: No rash noted. No pallor.  Psychiatric: He has a normal mood and affect. His behavior is normal.  Nursing note and vitals reviewed.   ED Course  Procedures (including critical care time) Labs Review Labs Reviewed  BASIC  METABOLIC PANEL - Abnormal; Notable for the following:    Glucose, Bld 102 (*)    All other components within normal limits  CBC  D-DIMER, QUANTITATIVE (NOT AT [facility])  Rosezena Sensor, ED    Imaging Review Dg Chest 2 View  05/27/2015  CLINICAL DATA:  Chest tightness, pain down LEFT arm, knot at upper LEFT chest anteriorly for few weeks ; open heart surgery as a child EXAM: CHEST  2 VIEW COMPARISON:  01/28/2014 FINDINGS: Normal heart size, mediastinal contours, and pulmonary vascularity. Lungs clear. No pleural effusion or pneumothorax. Bones unremarkable. IMPRESSION: No acute abnormalities. Electronically Signed   By: Ulyses Southward M.D.   On: 05/27/2015 15:55   I have personally reviewed and evaluated these images and lab results as part of my medical decision-making.   EKG Interpretation   Date/Time:  Monday May 27 2015 14:43:58 EDT Ventricular Rate:  113 PR Interval:  129 QRS Duration: 87 QT Interval:  314 QTC Calculation: 430 R Axis:   86 Text Interpretation:  Sinus tachycardia HEART RATE INCREASED SINCE 2012  Otherwise no significant change Confirmed by Thai Burgueno MD, DANIEL [phone]) on  05/27/2015 4:47:14 PM      MDM   Final diagnoses:  Muscle strain of chest wall, initial encounter    50 yo M with a chief complaint of left-sided chest wall pain. This been going on for the past couple days. Patient also having some significant GI symptoms with this as well. Initial troponin was negative. Feel that this is completely atypical for cardiac chest pain. This patient has had multiple DVTs in the past there is some concern for PE will obtain a d-dimer to rule out. Patient is also persistently tachycardic. Will give pain medicine and IV fluids.  Ddimer negative, d/c home.   6:05 PM:  I have discussed the diagnosis/risks/treatment options with the patient and family and believe the pt to be eligible for discharge home to follow-up with PCP. We also discussed returning to the ED immediately  if new or worsening sx occur. We discussed the sx which are most concerning (e.g., sudden worsening pain, fever, inability to tolerate by mouth) that necessitate immediate return. Medications administered to the patient during their visit and any new prescriptions provided to the patient are listed below.  Medications given during this visit Medications  acetaminophen (TYLENOL) tablet 1,000 mg (1,000 mg Oral Not Given 05/27/15 1754)  ibuprofen (ADVIL,MOTRIN) tablet 800 mg (800 mg Oral Not Given 05/27/15 1754)  sodium chloride 0.9 % bolus 1,000 mL (0 mLs Intravenous Stopped 05/27/15 1755)  oxyCODONE (Oxy IR/ROXICODONE) immediate release tablet 5 mg (5 mg Oral Given 05/27/15 1753)  gi cocktail (Maalox,Lidocaine,Donnatal) (30 mLs Oral Given 05/27/15 1754)    New Prescriptions   No medications on file    The patient appears reasonably screen and/or stabilized for discharge and I doubt any other medical condition or other [facility] requiring further screening, evaluation, or treatment in the ED at this time prior to discharge.    Melene Plan, DO 05/27/15 1805

## 2015-05-27 NOTE — ED Notes (Signed)
Patient requesting to speak to Dr. Adela LankFloyd. MD to bedside

## 2015-05-27 NOTE — ED Notes (Signed)
MD at bedside. 

## 2015-05-27 NOTE — ED Notes (Signed)
Pt c/o "a knot" on his L upper chest. Pt has had this knot for a few weeks and sts that it comes and goes. Pt sts when it's there it is sore to touch and causes pain in L chest, L side, and L arm. Pt sts pain worsens with movement on extremities. Pt has significant cardiac history and is concerned he is having a heart attack. Knot palpated by this RN. No obvious change to skin. A&Ox4 and ambulatory. Pt sts when he breathes deeply the knot feels tight.

## 2015-09-17 DIAGNOSIS — M5412 Radiculopathy, cervical region: Secondary | ICD-10-CM | POA: Insufficient documentation

## 2015-09-20 ENCOUNTER — Other Ambulatory Visit: Payer: Self-pay | Admitting: Orthopedic Surgery

## 2015-09-20 DIAGNOSIS — M542 Cervicalgia: Secondary | ICD-10-CM

## 2015-10-01 ENCOUNTER — Inpatient Hospital Stay: Admission: RE | Admit: 2015-10-01 | Payer: No Typology Code available for payment source | Source: Ambulatory Visit

## 2015-10-21 ENCOUNTER — Other Ambulatory Visit: Payer: No Typology Code available for payment source

## 2016-01-29 ENCOUNTER — Ambulatory Visit: Payer: No Typology Code available for payment source | Admitting: Physician Assistant

## 2016-02-18 ENCOUNTER — Ambulatory Visit (INDEPENDENT_AMBULATORY_CARE_PROVIDER_SITE_OTHER): Payer: 59 | Admitting: Sports Medicine

## 2016-02-18 ENCOUNTER — Encounter: Payer: Self-pay | Admitting: Sports Medicine

## 2016-02-18 DIAGNOSIS — M7542 Impingement syndrome of left shoulder: Secondary | ICD-10-CM | POA: Diagnosis not present

## 2016-02-18 DIAGNOSIS — M25512 Pain in left shoulder: Secondary | ICD-10-CM | POA: Insufficient documentation

## 2016-02-18 MED ORDER — MELOXICAM 15 MG PO TABS: ORAL_TABLET | ORAL | 3 refills | Status: DC

## 2016-02-18 NOTE — Assessment & Plan Note (Signed)
Negative x-rays at an outside facility, left sternoclavicular injection as above, formal physical therapy.  Meloxicam. If insufficient relief we will proceed with MRI.

## 2016-02-18 NOTE — Assessment & Plan Note (Signed)
Negative x-rays at an outside facility, subacromial injection as above, formal physical therapy.  Meloxicam. If insufficient relief we will proceed with MRI.

## 2016-02-18 NOTE — Progress Notes (Signed)
Subjective:    I'm seeing this patient as a consultation for:  Dr. 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 He tells me [facility] that [facility]. Pain is nonradicular.  Past medical history:  Negative.  See flowsheet/record as well for more information.  Surgical history: Negative.  See flowsheet/record as well for more information.  Family history: Negative.  See flowsheet/record as well for more information.  Social history: Negative.  See flowsheet/record as well for more information.  Allergies, and medications have been entered into the medical record, reviewed, and no changes needed.   Review of Systems: No headache, visual changes, nausea, vomiting, diarrhea, constipation, dizziness, abdominal pain, skin rash, fevers, chills, night sweats, weight loss, swollen lymph nodes, body aches, joint swelling, muscle aches, chest pain, shortness of breath, mood changes, visual or auditory hallucinations.   Objective:   General: Well Developed, well nourished, and in no acute distress.  Neuro/Psych: Alert and oriented x3, extra-ocular muscles intact, able to move all 4 extremities, sensation grossly intact. Skin: Warm and dry, no rashes noted.  Respiratory: Not using accessory muscles, speaking in full sentences, trachea midline.  Cardiovascular: Pulses palpable, no extremity edema. Abdomen: Does not appear distended. Left Shoulder: Inspection reveals no abnormalities, atrophy or asymmetry. Palpation is normal with no tenderness over AC joint or bicipital groove. ROM is full in all planes. Rotator cuff strength normal throughout. Positive Neer and Hawkin's tests, empty  can. Speeds and Yergason's tests normal. No labral pathology noted with negative Obrien's, negative crank, negative clunk, and good stability. Normal scapular function observed. No painful arc and no drop arm sign. No apprehension sign Positive cross arm sign, tenderness at the sternoclavicular joint.  Procedure: Real-time Ultrasound Guided Injection of left sternoclavicular joint Device: GE Logiq E  Verbal informed consent obtained.  Time-out conducted.  Noted no overlying erythema, induration, or other signs of local infection.  Skin prepped in a sterile fashion.  Local anesthesia: Topical Ethyl chloride.  With sterile technique and under real time ultrasound guidance:  1/2 mL kenalog 40, 1/2 mL lidocaine injected easily into the sternoclavicular joint. Completed without difficulty  Pain immediately resolved suggesting accurate placement of the medication.  Advised to call if fevers/chills, erythema, induration, drainage, or persistent bleeding.  Images permanently stored and available for review in the ultrasound unit.  Impression: Technically successful ultrasound guided injection.  Procedure: Real-time Ultrasound Guided Injection of left subacromial bursa Device: GE Logiq E  Verbal informed consent obtained.  Time-out conducted.  Noted no overlying erythema, induration, or other signs of local infection.  Skin prepped in a sterile fashion.  Local anesthesia: Topical Ethyl chloride.  With sterile technique and under real time ultrasound guidance:  1 mL kenalog 40, 1 mL lidocaine, 1 mL Marcaine injected easily. Completed without difficulty  Pain immediately resolved suggesting accurate placement of the medication.  Advised to call if fevers/chills, erythema, induration, drainage, or persistent bleeding.  Images permanently stored and available for review in the ultrasound unit.  Impression: Technically successful ultrasound guided injection.  Impression and Recommendations:    This case required medical decision making of moderate complexity.  Impingement syndrome of left shoulder Negative x-rays at an outside facility, subacromial injection  as above, formal physical therapy.  Meloxicam. If insufficient relief we will proceed with MRI.  Pain of left sternoclavicular joint Negative x-rays at an outside facility, left sternoclavicular injection as above, formal physical therapy.  Meloxicam. If insufficient relief we will proceed with MRI.

## 2016-03-04 ENCOUNTER — Ambulatory Visit: Payer: 59 | Admitting: Physical Therapy

## 2016-03-17 ENCOUNTER — Ambulatory Visit: Payer: 59 | Admitting: Sports Medicine

## 2016-04-08 ENCOUNTER — Ambulatory Visit (INDEPENDENT_AMBULATORY_CARE_PROVIDER_SITE_OTHER): Payer: 59 | Admitting: Physician Assistant

## 2016-04-08 ENCOUNTER — Encounter: Payer: Self-pay | Admitting: Physician Assistant

## 2016-04-08 VITALS — BP 157/98 | HR 93 | Wt 184.0 lb

## 2016-04-08 DIAGNOSIS — N3943 Post-void dribbling: Secondary | ICD-10-CM

## 2016-04-08 DIAGNOSIS — N529 Male erectile dysfunction, unspecified: Secondary | ICD-10-CM | POA: Diagnosis not present

## 2016-04-08 DIAGNOSIS — F411 Generalized anxiety disorder: Secondary | ICD-10-CM | POA: Diagnosis not present

## 2016-04-08 DIAGNOSIS — Z23 Encounter for immunization: Secondary | ICD-10-CM | POA: Diagnosis not present

## 2016-04-08 DIAGNOSIS — N401 Enlarged prostate with lower urinary tract symptoms: Secondary | ICD-10-CM | POA: Diagnosis not present

## 2016-04-08 DIAGNOSIS — Z0000 Encounter for general adult medical examination without abnormal findings: Secondary | ICD-10-CM | POA: Diagnosis not present

## 2016-04-08 DIAGNOSIS — M7542 Impingement syndrome of left shoulder: Secondary | ICD-10-CM

## 2016-04-08 DIAGNOSIS — R030 Elevated blood-pressure reading, without diagnosis of hypertension: Secondary | ICD-10-CM | POA: Insufficient documentation

## 2016-04-08 DIAGNOSIS — F902 Attention-deficit hyperactivity disorder, combined type: Secondary | ICD-10-CM | POA: Insufficient documentation

## 2016-04-08 MED ORDER — ATOMOXETINE HCL 40 MG PO CAPS: 40.0000 mg | ORAL_CAPSULE | Freq: Every day | ORAL | 0 refills | Status: DC

## 2016-04-08 MED ORDER — CIALIS 5 MG PO TABS: 5.0000 mg | ORAL_TABLET | Freq: Every day | ORAL | 3 refills | Status: DC

## 2016-04-08 MED ORDER — MELOXICAM 15 MG PO TABS: 15.0000 mg | ORAL_TABLET | Freq: Every day | ORAL | 3 refills | Status: DC | PRN

## 2016-04-08 NOTE — Progress Notes (Signed)
HPI:                                                                Jacob Mcmillan is a 51 y.o. male who presents to [facility] Care [facility] today to establish care   Current Concerns include anxiety, sleep  Anxiety: patient reports increased stress related to familial problems. He endorses excessive worry, difficulty relaxing, psychomotor agitation, and restlessness. He also reports frequent nighttime awakenings after 3-4 hours of sleep. He is able to fall back asleep, but feels his sleep quality is poor. He denies anhedonia and depressed mood. Denies SI/HI. Denies AH/VH. He has never taken any medications for anxiety/depression in the past. He has never been to counseling/CBT. Family hx significant for depression in his mother and sister.  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 It is unclear nature of his CHD, but sounds like he may have had coronary artery procedure.  He is interested in [facility] Maintenance Health Maintenance  Topic Date Due  . Janet Berlin  02/[birth year]1985  . COLONOSCOPY  02/26/2016  . INFLUENZA VACCINE  10/25/2018 (Originally 09/24/2015)  . HIV Screening  Completed     Health Habits  Diet: good  Exercise: daily 1 hour, weights, swimming  ETOH: 1-2/week  Tobacco: no, never  Drugs: no   Past Medical History:  Diagnosis Date  . Anxiety   . Clotting disorder (HCC)   . Heart murmur   . History of open heart surgery   . Insomnia    Past Surgical History:  Procedure Laterality Date  . ARTERY REPAIR     as a child  . BACK SURGERY    . SPINE SURGERY     Social History  Substance Use Topics  . Smoking status: Never Smoker  . Smokeless tobacco: Never Used  . Alcohol use No   family history includes Depression in his mother and sister; Hyperlipidemia in his father and mother; Hypertension in his father; Melanoma in his mother.  ROS: Denies fever, chills, weight loss, night  sweats. Denies vision change, headache, chest pain, dyspnea, lightheadedness, and edema.    Medications: Current Outpatient Prescriptions  Medication Sig Dispense Refill  . CIALIS 5 MG tablet TK 1 T PO D  1  . meloxicam (MOBIC) 15 MG tablet One tab PO qAM with breakfast for 2 weeks, then daily prn pain. 30 tablet 3   No current facility-administered medications for this visit.    Allergies  Allergen Reactions  . Amoxicillin Swelling    Patient states amoxicillin causes swelling one time. Has had regular penicillin multiple times since then without reaction.  . Codeine     "throw up"       Objective:  BP (!) 157/98   Pulse 93   Wt 184 lb (83.5 kg)   BMI 26.40 kg/m  Gen: well-groomed, cooperative, not ill-appearing, no distress HEENT: normal conjunctiva, TM's clear, oropharynx clear, moist mucus membranes, no thyromegaly or tenderness Pulm: Normal work of breathing, clear to auscultation bilaterally CV: Normal rate, regular rhythm, s1 and s2 distinct, no murmurs, clicks or rubs appreciated on this exam, no carotid bruit GI: soft, nondistended, nontender, no masses Neuro: alert and oriented x 3, EOM's intact, PERRLA, DTR's intact  MSK: strength 5/5 and symmetric, normal gait, distal pulses intact, no peripheral edema Skin: warm and dry, no rashes or lesions on exposed skin Psych: normal affect, pleasant mood, talkative, normal speech and thought content   GAD 7 : Generalized Anxiety Score 04/08/2016  Nervous, Anxious, on Edge 2  Control/stop worrying 2  Worry too much - different things 2  Trouble relaxing 2  Restless 2  Easily annoyed or irritable 2  Afraid - awful might happen 2  Total GAD 7 Score 14      Assessment and Plan: 51 y.o. male with   Encounter for preventive health examination - CBC - COMPLETE METABOLIC PANEL WITH GFR - Hemoglobin A1c - Lipid Panel w/reflex Direct LDL - TSH - T4, free - Tdap vaccine given - declined influenza - Cologuard  ordered for colon cancer screening  Elevated blood pressure reading - BP out of range at last 3 appointments. I suspect there is underlying essential HTN - patient feels this is due to stress / white coat syndrome. Asymptomatic today. He is not open to starting antihypertensive therapy at this time - instructed on how to check pressures at home - DASH eating plan - follow-up with readings in 1 month  GAD (generalized anxiety disorder), ADHD combined type - GAD7 score of 14, 12/18 inattentive and hyperactive symptoms on the adult ADHD ASRS - atomoxetine (STRATTERA) 40 MG capsule; Take 1 capsule (40 mg total) by mouth daily.  Dispense: 30 capsule; Refill: 0  Impingement syndrome of left shoulder - meloxicam (MOBIC) 15 MG tablet; Take 1 tablet (15 mg total) by mouth daily as needed for pain.  Dispense: 30 tablet; Refill: 3  Vasculogenic erectile dysfunction, unspecified vasculogenic erectile dysfunction type Benign prostatic hyperplasia with post-void dribbling - CIALIS 5 MG tablet; Take 1 tablet (5 mg total) by mouth daily.  Dispense: 30 tablet; Refill: 3  Patient education and anticipatory guidance given Patient agrees with treatment plan Follow-up in 4 weeks or sooner as needed  Levonne Hubertharley E. Karmella Bouvier PA-C

## 2016-04-08 NOTE — Patient Instructions (Addendum)
Start Strattera 40mg  daily for anxiety Check your blood pressures at home at least 3 days a week at the same time each day in a relaxed setting DASH eating plan - limit salt! Return in 4 weeks   How to Take Your Blood Pressure Blood pressure is a measurement of how strongly your blood is pressing against the walls of your arteries. Arteries are blood vessels that carry blood from your heart throughout your body. Your health care provider takes your blood pressure at each office visit. You can also take your own blood pressure at home with a blood pressure machine. You may need to take your own blood pressure:  To confirm a diagnosis of high blood pressure (hypertension).  To monitor your blood pressure over time.  To make sure your blood pressure medicine is working. Supplies needed: To take your blood pressure, you will need a blood pressure machine. You can buy a blood pressure machine, or blood pressure monitor, at most drugstores or online. There are several types of home blood pressure monitors. When choosing one, consider the following:  Choose a monitor that has an arm cuff.  Choose a monitor that wraps snugly around your upper arm. You should be able to fit only one finger between your arm and the cuff.  Do not choose a monitor that measures your blood pressure from your wrist or finger. Your health care provider can suggest a reliable monitor that will meet your needs. How to prepare To get the most accurate reading, avoid the following for 30 minutes before you check your blood pressure:  Drinking caffeine.  Drinking alcohol.  Eating.  Smoking.  Exercising. Five minutes before you check your blood pressure:  Empty your bladder.  Sit quietly without talking in a dining chair, rather than in a soft couch or armchair. How to take your blood pressure To check your blood pressure, follow the instructions in the manual that came with your blood pressure monitor. If you  have a digital blood pressure monitor, the instructions may be as follows: 1. Sit up straight. 2. Place your feet on the floor. Do not cross your ankles or legs. 3. Rest your left arm at the level of your heart on a table or desk or on the arm of a chair. 4. Pull up your shirt sleeve. 5. Wrap the blood pressure cuff around the upper part of your left arm, 1 inch (2.5 cm) above your elbow. It is best to wrap the cuff around bare skin. 6. Fit the cuff snugly around your arm. You should be able to place only one finger between the cuff and your arm. 7. Position the cord inside the groove of your elbow. 8. Press the power button. 9. Sit quietly while the cuff inflates and deflates. 10. Read the digital reading on the monitor screen and write it down (record it). 11. Wait 2-3 minutes, then repeat the steps starting at step 1. What does my blood pressure reading mean? A blood pressure reading is recorded as two numbers, such as "120 over 80" (or 120/80). The first ("top") number is called the systolic pressure. It is a measure of the pressure in your arteries as the heart beats. The second ("bottom") number is called the diastolic pressure. It is a measure of the pressure in your arteries as the heart relaxes between beats. Blood pressure is classified into four stages. The following are the stages for adults who do not have a short-term serious illness or a chronic  condition. Systolic pressure and diastolic pressure are measured in a unit called mm Hg. Normal  Systolic pressure: below 120.  Diastolic pressure: below 80. Prehypertension  Systolic pressure: 120-139.  Diastolic pressure: 80-89. Hypertension stage 1  Systolic pressure: 140-159.  Diastolic pressure: 90-99. Hypertension stage 2  Systolic pressure: 160 or above.  Diastolic pressure: 100 or above. You can have prehypertension or hypertension even if only the systolic or only the diastolic number in your reading is higher than  normal. Follow these instructions at home:  Check your blood pressure as often as recommended by your health care provider.  Take your monitor to the next appointment with your health care provider to make sure:  That you are using it correctly.  That it provides accurate readings.  Be sure you understand what your goal blood pressure numbers are.  Tell your health care provider if you are having any side effects from blood pressure medicine. Contact a health care provider if:  Your blood pressure is consistently high. Get help right away if:  Your systolic blood pressure is higher than 180.  Your diastolic blood pressure is higher than 110. This information is not intended to replace advice given to you by your health care provider. Make sure you discuss any questions you have with your health care provider. Document Released: 07/19/2015 Document Revised: 10/01/2015 Document Reviewed: 07/19/2015 Elsevier Interactive Patient Education  2017 Elsevier Inc.   Atomoxetine capsules What is this medicine? ATOMOXETINE (AT oh mox e teen) is used to treat attention deficit/hyperactivity disorder, also known as ADHD. It is not a stimulant like other drugs for ADHD. This drug can improve attention span, concentration, and emotional control. It can also reduce restless or overactive behavior. This medicine may be used for other purposes; ask your health care provider or pharmacist if you have questions. COMMON BRAND NAME(S): Strattera What should I tell my health care provider before I take this medicine? They need to know if you have any of these conditions: -glaucoma -high or low blood pressure -history of stroke -irregular heartbeat or other cardiac disease -liver disease -mania or bipolar disorder -pheochromocytoma -suicidal thoughts -an unusual or allergic reaction to atomoxetine, other medicines, foods, dyes, or preservatives -pregnant or trying to get  pregnant -breast-feeding How should I use this medicine? Take this medicine by mouth with a glass of water. Follow the directions on the prescription label. You can take it with or without food. If it upsets your stomach, take it with food. If you have difficulty sleeping and you take more than 1 dose per day, take your last dose before 6 PM. Take your medicine at regular intervals. Do not take it more often than directed. Do not stop taking except on your doctor's advice. A special MedGuide will be given to you by the pharmacist with each prescription and refill. Be sure to read this information carefully each time. Talk to your pediatrician regarding the use of this medicine in children. While this drug may be prescribed for children as young as 6 years for selected conditions, precautions do apply. Overdosage: If you think you have taken too much of this medicine contact a poison control center or emergency room at once. NOTE: This medicine is only for you. Do not share this medicine with others. What if I miss a dose? If you miss a dose, take it as soon as you can. If it is almost time for your next dose, take only that dose. Do not take double or  extra doses. What may interact with this medicine? Do not take this medicine with any of the following medications: -cisapride -dofetilide -dronedarone -MAOIs like Carbex, Eldepryl, Marplan, Nardil, and Parnate -pimozide -reboxetine -thioridazine -ziprasidone This medicine may also interact with the following medications: -certain medicines for blood pressure, heart disease, irregular heart beat -certain medicines for depression, anxiety, or psychotic disturbances -certain medicines for lung disease like albuterol -cold or allergy medicines -fluoxetine -medicines that increase blood pressure like dopamine, dobutamine, or ephedrine -other medicines that prolong the QT interval (cause an abnormal heart  rhythm) -paroxetine -quinidine -stimulant medicines for attention disorders, weight loss, or to stay awake This list may not describe all possible interactions. Give your health care provider a list of all the medicines, herbs, non-prescription drugs, or dietary supplements you use. Also tell them if you smoke, drink alcohol, or use illegal drugs. Some items may interact with your medicine. What should I watch for while using this medicine? It may take a week or more for this medicine to take effect. This is why it is very important to continue taking the medicine and not miss any doses. If you have been taking this medicine regularly for some time, do not suddenly stop taking it. Ask your doctor or health care professional for advice. Rarely, this medicine may increase thoughts of suicide or suicide attempts in children and teenagers. Call your child's health care professional right away if your child or teenager has new or increased thoughts of suicide or has changes in mood or behavior like becoming irritable or anxious. Regularly monitor your child for these behavioral changes. For males, contact you doctor or health care professional right away if you have an erection that lasts longer than 4 hours or if it becomes painful. This may be a sign of serious problem and must be treated right away to prevent permanent damage. You may get drowsy or dizzy. Do not drive, use machinery, or do anything that needs mental alertness until you know how this medicine affects you. Do not stand or sit up quickly, especially if you are an older patient. This reduces the risk of dizzy or fainting spells. Alcohol can make you more drowsy and dizzy. Avoid alcoholic drinks. Do not treat yourself for coughs, colds or allergies without asking your doctor or health care professional for advice. Some ingredients can increase possible side effects. Your mouth may get dry. Chewing sugarless gum or sucking hard candy, and  drinking plenty of water will help. What side effects may I notice from receiving this medicine? Side effects that you should report to your doctor or health care professional as soon as possible: -allergic reactions like skin rash, itching or hives, swelling of the face, lips, or tongue -breathing problems -chest pain -dark urine -fast, irregular heartbeat -general ill feeling or flu-like symptoms -high blood pressure -males: prolonged or painful erection -stomach pain or tenderness -trouble passing urine or change in the amount of urine -vomiting -weight loss -yellowing of the eyes or skin Side effects that usually do not require medical attention (report to your doctor or health care professional if they continue or are bothersome): -change in sex drive or performance -constipation or diarrhea -headache -loss of appetite -menstrual period irregularities -nausea -stomach upset This list may not describe all possible side effects. Call your doctor for medical advice about side effects. You may report side effects to FDA at 1-800-FDA-1088. Where should I keep my medicine? Keep out of the reach of children. Store at room temperature  between 15 and 30 degrees C (59 and 86 degrees F). Throw away any unused medication after the expiration date. NOTE: This sheet is a summary. It may not cover all possible information. If you have questions about this medicine, talk to your doctor, pharmacist, or health care provider.  2017 Elsevier/Gold Standard (2013-06-23 15:29:22)

## 2016-04-09 ENCOUNTER — Telehealth: Payer: Self-pay | Admitting: *Deleted

## 2016-04-12 ENCOUNTER — Encounter: Payer: Self-pay | Admitting: Physician Assistant

## 2016-04-17 ENCOUNTER — Telehealth: Payer: Self-pay | Admitting: Physician Assistant

## 2016-04-17 NOTE — Telephone Encounter (Signed)
Prior Berkley Harveyuth is pending. Ref # is [id number]

## 2016-04-17 NOTE — Telephone Encounter (Signed)
Pt came in and stated that he still has not heard anything about his medication Cialis and so I spoke with andrea and she stated that she is in the middle of processing his prior auth. The pt wants to know if he can be prescribed something else until we hear back from the insurance company. Thanks

## 2016-04-20 MED ORDER — SILDENAFIL CITRATE 25 MG PO TABS: ORAL_TABLET | ORAL | 0 refills | Status: DC

## 2016-04-20 NOTE — Telephone Encounter (Signed)
I can write a prescription for Viagra, but there is a good chance that insurance may require a prior auth for that as well. It is probably easiest to wait for the PA on the current prescription

## 2016-04-20 NOTE — Telephone Encounter (Signed)
Patient requested 7258-month supply of Viagra pending his Cialis PA. Prescription sent.

## 2016-04-20 NOTE — Telephone Encounter (Signed)
I called pt and he stated that he would like to have a few Viagra pills to hold him over until he hears back from the PA for Cialis.He stated that he will just pay cash for it if he needs to. He said he has been waiting a while already and would like to have something to help him while he waits. Thanks

## 2016-04-24 ENCOUNTER — Telehealth: Payer: Self-pay | Admitting: *Deleted

## 2016-04-24 MED ORDER — SILDENAFIL CITRATE 20 MG PO TABS: ORAL_TABLET | ORAL | 11 refills | Status: DC

## 2016-04-24 NOTE — Telephone Encounter (Signed)
[facility] For  20 mg its $94.90. Please can you send a new prescription for sildenafil 20 mg # 30 for this patient? He is going to be a cash pay patient .

## 2016-04-24 NOTE — Telephone Encounter (Signed)
[facility]

## 2016-04-29 NOTE — Telephone Encounter (Signed)
Denied see phone notes

## 2016-05-06 ENCOUNTER — Ambulatory Visit: Payer: 59 | Admitting: Physician Assistant

## 2016-05-18 ENCOUNTER — Encounter: Payer: Self-pay | Admitting: Family Medicine

## 2016-05-18 ENCOUNTER — Ambulatory Visit (INDEPENDENT_AMBULATORY_CARE_PROVIDER_SITE_OTHER): Payer: 59 | Admitting: Family Medicine

## 2016-05-18 VITALS — BP 134/89 | HR 96 | Wt 183.0 lb

## 2016-05-18 DIAGNOSIS — M25512 Pain in left shoulder: Secondary | ICD-10-CM | POA: Diagnosis not present

## 2016-05-18 DIAGNOSIS — M7542 Impingement syndrome of left shoulder: Secondary | ICD-10-CM | POA: Diagnosis not present

## 2016-05-18 MED ORDER — DICLOFENAC SODIUM 1 % TD GEL: 4.0000 g | Freq: Four times a day (QID) | TRANSDERMAL | 11 refills | Status: DC

## 2016-05-18 NOTE — Patient Instructions (Signed)
Thank you for coming in today. Use voltaren gel.  You should hear about the MRI soon.  Return a few days after the MRI.

## 2016-05-18 NOTE — Progress Notes (Signed)
   Subjective:    I'm seeing this patient as a consultation for:  Jacob Stableharley Elizabeth Cummings, PA-C   CC: left shoulder pain  HPI: Patient notes a several month history of left shoulder and chest wall pain. He was previously seen by Dr. Jeannie Fendhekkkekandam and had subacromial bursa injection and sternoclavicular joint injections. This caused immediate pain relief that lasted a few weeks. Additionally she had x-rays are unremarkable. He notes significant pain with overhead motion reaching back and his lateral upper arm. He denies any significant pain radiating to the hand. Additionally he notes a painful mass on his anterior chest wall at the location of the left sternoclavicular joint. The pain is interfering with his ability to work and sleep. He has tried a home exercise program physical therapy activities which have not helped much.  Past medical history, Surgical history, Family history not pertinant except as noted below, Social history, Allergies, and medications have been entered into the medical record, reviewed, and no changes needed.   Review of Systems: No headache, visual changes, nausea, vomiting, diarrhea, constipation, dizziness, abdominal pain, skin rash, fevers, chills, night sweats, weight loss, swollen lymph nodes, body aches, joint swelling, muscle aches, chest pain, shortness of breath, mood changes, visual or auditory hallucinations.   Objective:    Vitals:   05/18/16 1106  BP: 134/89  Pulse: 96   General: Well Developed, well nourished, and in no acute distress.  Neuro/Psych: Alert and oriented x3, extra-ocular muscles intact, able to move all 4 extremities, sensation grossly intact. Skin: Warm and dry, no rashes noted.  Respiratory: Not using accessory muscles, speaking in full sentences, trachea midline.  Cardiovascular: Pulses palpable, no extremity edema. Abdomen: Does not appear distended. MSK:  Left shoulder. Normal-appearing. Diffusely tender. Range of motion  significantly limited active and passive abduction and internal rotation. Active abduction 70 passive 120 External rotation normal Internal rotation to the iliac crest. Positive Hawkins and Neer's test. Patient guards with exam. Tender to palpation left sternoclavicular joint.   No results found for this or any previous visit (from the past 24 hour(s)). No results found.  Impression and Recommendations:    Assessment and Plan: 51 y.o. male with Left shoulder pain due to  rotator cuff tendinopathy likely. Patient has failed conservative management. Plan for MRI. We'll try to include sternoclavicular joint if possible..  Additionally we'll try treating with diclofenac gel. Follow-up after MRI.   Discussed warning signs or symptoms. Please see discharge instructions. Patient expresses understanding.

## 2016-05-19 ENCOUNTER — Telehealth: Payer: Self-pay

## 2016-05-19 NOTE — Telephone Encounter (Signed)
Pre Authorization was sent to Cover My Meds. Key: [id number].

## 2016-05-21 NOTE — Telephone Encounter (Signed)
Diclofenac denied by insurance . Called pt to let him know about Goodrx coupon the cost of this medication will be about $30. The patient states he had decided he was not going to pick up this medication prior to me calling him

## 2016-05-26 ENCOUNTER — Ambulatory Visit: Payer: 59 | Admitting: Physician Assistant

## 2016-05-28 ENCOUNTER — Ambulatory Visit (INDEPENDENT_AMBULATORY_CARE_PROVIDER_SITE_OTHER): Payer: 59 | Admitting: Physician Assistant

## 2016-05-28 ENCOUNTER — Ambulatory Visit (INDEPENDENT_AMBULATORY_CARE_PROVIDER_SITE_OTHER): Payer: 59

## 2016-05-28 VITALS — BP 145/102 | HR 106 | Wt 181.0 lb

## 2016-05-28 DIAGNOSIS — R030 Elevated blood-pressure reading, without diagnosis of hypertension: Secondary | ICD-10-CM | POA: Diagnosis not present

## 2016-05-28 DIAGNOSIS — F411 Generalized anxiety disorder: Secondary | ICD-10-CM | POA: Diagnosis not present

## 2016-05-28 DIAGNOSIS — M25512 Pain in left shoulder: Secondary | ICD-10-CM

## 2016-05-28 MED ORDER — BUSPIRONE HCL 7.5 MG PO TABS: 7.5000 mg | ORAL_TABLET | Freq: Two times a day (BID) | ORAL | 1 refills | Status: DC

## 2016-05-28 MED ORDER — CELECOXIB 200 MG PO CAPS: ORAL_CAPSULE | ORAL | 2 refills | Status: DC

## 2016-05-28 NOTE — Progress Notes (Signed)
HPI:                                                                Jacob Mcmillan is a 51 y.o. male who presents to [facility]: [facility] Medicine today for anxiety and blood pressure follow-up  Patient has been checking BP's at home for the last 4 weeks. BP range 98-131/65-85 with majority of blood pressures in 120's systolic and 70's diastolic. Denies vision change, headache, chest pain with exertion, orthopnea, lightheadedness, syncope and edema.   Patient has not been taking Strattera. Patient states he does not want to take any medications that "mess with serotonin." Patient states he took his mother's Xanax and this is the only thing that has helped him.   Patient also complains of a "lump" of his left chest wall that he states fluctuates in size. Endorses tenderness. Denies known injury or trauma. Not relieved with voltaren gel or NSAIDs.  Past Medical History:  Diagnosis Date  . Anxiety   . CHD (congenital heart disease)   . Clotting disorder (HCC)   . Heart murmur   . History of open heart surgery   . Insomnia    Past Surgical History:  Procedure Laterality Date  . ARTERY REPAIR     as a child  . BACK SURGERY    . SPINE SURGERY     Social History  Substance Use Topics  . Smoking status: Never Smoker  . Smokeless tobacco: Never Used  . Alcohol use 1.2 oz/week    2 Cans of beer per week   family history includes Alcohol abuse in his father; Depression in his mother and sister; Hyperlipidemia in his father and mother; Hypertension in his father; Melanoma in his sister.  ROS: negative except as noted in the HPI  Medications: Current Outpatient Prescriptions  Medication Sig Dispense Refill  . CIALIS 5 MG tablet Take 1 tablet (5 mg total) by mouth daily. 30 tablet 3  . diclofenac sodium (VOLTAREN) 1 % GEL Apply 4 g topically 4 (four) times daily. To affected joint. 100 g 11  . sildenafil (REVATIO) 20 MG tablet Take 1-2 tabs 1 hour  prior to sexual activity 30 tablet 11  . busPIRone (BUSPAR) 7.5 MG tablet Take 1 tablet (7.5 mg total) by mouth 2 (two) times daily. 60 tablet 1  . celecoxib (CELEBREX) 200 MG capsule One to 2 tablets by mouth daily as needed for pain. 60 capsule 2   No current facility-administered medications for this visit.    Allergies  Allergen Reactions  . Amoxicillin Swelling    Patient states amoxicillin causes swelling one time. Has had regular penicillin multiple times since then without reaction.  . Codeine     "throw up"       Objective:  BP (!) 145/102   Pulse (!) 106   Wt 181 lb (82.1 kg)   BMI 25.97 kg/m  Gen: well-groomed, cooperative, not ill-appearing, no distress Pulm: Normal work of breathing, normal phonation, clear to auscultation bilaterally CV: tachycardic, regular rhythm, s1 and s2 distinct, no murmurs, clicks or rubs  Neuro: alert and oriented x 3, EOM's intact, normal tone, no tremor MSK: tenderness over left sternoclavicular joint, no deformity Skin: warm and dry, no rashes, well-healed sternotomy  scar, no cyanosis Psych: good eye contact, anxious affect, euthymic mood, normal speech  Depression screen [facility] 2/9 05/28/2016 12/07/2014  Decreased Interest 0 0  Down, Depressed, Hopeless 0 0  PHQ - 2 Score 0 0   GAD 7 : Generalized Anxiety Score 05/28/2016 04/08/2016  Nervous, Anxious, on Edge 2 2  Control/stop worrying 3 2  Worry too much - different things 2 2  Trouble relaxing 2 2  Restless 2 2  Easily annoyed or irritable 2 2  Afraid - awful might happen 1 2  Total GAD 7 Score 14 14      Assessment and Plan: 51 y.o. male with   1. White coat syndrome without diagnosis of hypertension - BP out of range, but patient's home pressures are normotensive. Patient has known anxiety disorder.  2. GAD (generalized anxiety disorder) - GAD7 score of 14 - moderate - busPIRone (BUSPAR) 7.5 MG tablet; Take 1 tablet (7.5 mg total) by mouth 2 (two) times daily.  Dispense:  60 tablet; Refill: 1 - close follow-up in 2 weeks  3. Pain of left sternoclavicular joint - consulted Dr. Benjamin Stain (see A&P note) - DG Clavicle Left - MR STERNUM WO CONTRAST; Future - celecoxib (CELEBREX) 200 MG capsule; One to 2 tablets by mouth daily as needed for pain.  Dispense: 60 capsule; Refill: 2   Patient education and anticipatory guidance given Patient agrees with treatment plan Follow-up in 2 weeks or sooner as needed if symptoms worsen or fail to improve  Levonne Hubert PA-C

## 2016-05-28 NOTE — Progress Notes (Signed)
  Subjective:    CC: Left anterior chest wall pain  HPI:  This is a 51 year old male, he has known left sternoclavicular osteoarthritis. I injected this about 4 months ago when he did well. He now has a recurrence of pain, moderate, persistent, localized over the left sternoclavicular joint without radiation. Worse with reaching across his chest and it occurred when throwing something across his chest with his left arm. He has since stopped his meloxicam.  Past medical history:  Negative.  See flowsheet/record as well for more information.  Surgical history: Negative.  See flowsheet/record as well for more information.  Family history: Negative.  See flowsheet/record as well for more information.  Social history: Negative.  See flowsheet/record as well for more information.  Allergies, and medications have been entered into the medical record, reviewed, and no changes needed.   Review of Systems: No fevers, chills, night sweats, weight loss, chest pain, or shortness of breath.   Objective:    General: Well Developed, well nourished, and in no acute distress.  Neuro: Alert and oriented x3, extra-ocular muscles intact, sensation grossly intact.  HEENT: Normocephalic, atraumatic, pupils equal round reactive to light, neck supple, no masses, no lymphadenopathy, thyroid nonpalpable.  Skin: Warm and dry, no rashes. Cardiac: Regular rate and rhythm, no murmurs rubs or gallops, no lower extremity edema.  Respiratory: Clear to auscultation bilaterally. Not using accessory muscles, speaking in full sentences. Left Shoulder: Slight fullness over the left sternoclavicular joint, tenderness to palpation here with a positive cross arm sign. Palpation is normal with no tenderness over AC joint or bicipital groove. ROM is full in all planes. Rotator cuff strength normal throughout. No signs of impingement with negative Neer and Hawkin's tests, empty can. Speeds and Yergason's tests normal. No labral  pathology noted with negative Obrien's, negative crank, negative clunk, and good stability. Normal scapular function observed. No painful arc and no drop arm sign. No apprehension sign  Impression and Recommendations:    Pain of left sternoclavicular joint Did well after injection 4 months ago, restarting anti-inflammatories, this time Celebrex, and I would like dedicated clavicular films. Differential does include Tiezte syndrome which also is treated with NSAIDs and steroid injection if not improved. Considering recurrence and proximity to the first and second costal cartilage I am going to also get an MRI limited of the chest wall.

## 2016-05-28 NOTE — Assessment & Plan Note (Addendum)
Did well after injection 4 months ago, restarting anti-inflammatories, this time Celebrex, and I would like dedicated clavicular films. Differential does include Tiezte syndrome which also is treated with NSAIDs and steroid injection if not improved. Considering recurrence and proximity to the first and second costal cartilage I am going to also get an MRI limited of the chest wall.

## 2016-05-28 NOTE — Patient Instructions (Addendum)
- Start Buspirone 1 pill twice a day for anxiety - Follow-up in 2 weeks   Generalized Anxiety Disorder, Adult Generalized anxiety disorder (GAD) is a mental health disorder. People with this condition constantly worry about everyday events. Unlike normal anxiety, worry related to GAD is not triggered by a specific event. These worries also do not fade or get better with time. GAD interferes with life functions, including relationships, work, and school. GAD can vary from mild to severe. People with severe GAD can have intense waves of anxiety with physical symptoms (panic attacks). What are the causes? The exact cause of GAD is not known. What increases the risk? This condition is more likely to develop in:  Women.  People who have a family history of anxiety disorders.  People who are very shy.  People who experience very stressful life events, such as the death of a loved one.  People who have a very stressful family environment. What are the signs or symptoms? People with GAD often worry excessively about many things in their lives, such as their health and family. They may also be overly concerned about:  Doing well at work.  Being on time.  Natural disasters.  Friendships. Physical symptoms of GAD include:  Fatigue.  Muscle tension or having muscle twitches.  Trembling or feeling shaky.  Being easily startled.  Feeling like your heart is pounding or racing.  Feeling out of breath or like you cannot take a deep breath.  Having trouble falling asleep or staying asleep.  Sweating.  Nausea, diarrhea, or irritable bowel syndrome (IBS).  Headaches.  Trouble concentrating or remembering facts.  Restlessness.  Irritability. How is this diagnosed? Your health care provider can diagnose GAD based on your symptoms and medical history. You will also have a physical exam. The health care provider will ask specific questions about your symptoms, including how severe  they are, when they started, and if they come and go. Your health care provider may ask you about your use of alcohol or drugs, including prescription medicines. Your health care provider may refer you to a mental health specialist for further evaluation. Your health care provider will do a thorough examination and may perform additional tests to rule out other possible causes of your symptoms. To be diagnosed with GAD, a person must have anxiety that:  Is out of his or her control.  Affects several different aspects of his or her life, such as work and relationships.  Causes distress that makes him or her unable to take part in normal activities.  Includes at least three physical symptoms of GAD, such as restlessness, fatigue, trouble concentrating, irritability, muscle tension, or sleep problems. Before your health care provider can confirm a diagnosis of GAD, these symptoms must be present more days than they are not, and they must last for six months or longer. How is this treated? The following therapies are usually used to treat GAD:  Medicine. Antidepressant medicine is usually prescribed for long-term daily control. Antianxiety medicines may be added in severe cases, especially when panic attacks occur.  Talk therapy (psychotherapy). Certain types of talk therapy can be helpful in treating GAD by providing support, education, and guidance. Options include:  Cognitive behavioral therapy (CBT). People learn coping skills and techniques to ease their anxiety. They learn to identify unrealistic or negative thoughts and behaviors and to replace them with positive ones.  Acceptance and commitment therapy (ACT). This treatment teaches people how to be mindful as a way to  cope with unwanted thoughts and feelings.  Biofeedback. This process trains you to manage your body's response (physiological response) through breathing techniques and relaxation methods. You will work with a therapist while  machines are used to monitor your physical symptoms.  Stress management techniques. These include yoga, meditation, and exercise. A mental health specialist can help determine which treatment is best for you. Some people see improvement with one type of therapy. However, other people require a combination of therapies. Follow these instructions at home:  Take over-the-counter and prescription medicines only as told by your health care provider.  Try to maintain a normal routine.  Try to anticipate stressful situations and allow extra time to manage them.  Practice any stress management or self-calming techniques as taught by your health care provider.  Do not punish yourself for setbacks or for not making progress.  Try to recognize your accomplishments, even if they are small.  Keep all follow-up visits as told by your health care provider. This is important. Contact a health care provider if:  Your symptoms do not get better.  Your symptoms get worse.  You have signs of depression, such as:  A persistently sad, cranky, or irritable mood.  Loss of enjoyment in activities that used to bring you joy.  Change in weight or eating.  Changes in sleeping habits.  Avoiding friends or family members.  Loss of energy for normal tasks.  Feelings of guilt or worthlessness. Get help right away if:  You have serious thoughts about hurting yourself or others. If you ever feel like you may hurt yourself or others, or have thoughts about taking your own life, get help right away. You can go to your nearest emergency department or call:  Your local emergency services (911 in the [place]  A suicide crisis helpline, such as the National Suicide Prevention Lifeline at (705) 426-5997. This is open 24 hours a day. Summary  Generalized anxiety disorder (GAD) is a mental health disorder that involves worry that is not triggered by a specific event.  People with GAD often worry excessively  about many things in their lives, such as their health and family.  GAD may cause physical symptoms such as restlessness, trouble concentrating, sleep problems, frequent sweating, nausea, diarrhea, headaches, and trembling or muscle twitching.  A mental health specialist can help determine which treatment is best for you. Some people see improvement with one type of therapy. However, other people require a combination of therapies. This information is not intended to replace advice given to you by your health care provider. Make sure you discuss any questions you have with your health care provider. Document Released: 06/06/2012 Document Revised: 12/31/2015 Document Reviewed: 12/31/2015 Elsevier Interactive Patient Education  2017 [employer].

## 2016-06-11 ENCOUNTER — Ambulatory Visit: Payer: 59 | Admitting: Physician Assistant

## 2016-06-12 ENCOUNTER — Ambulatory Visit (INDEPENDENT_AMBULATORY_CARE_PROVIDER_SITE_OTHER): Payer: 59 | Admitting: Physician Assistant

## 2016-06-12 VITALS — BP 129/85 | HR 106 | Wt 180.0 lb

## 2016-06-12 DIAGNOSIS — F5105 Insomnia due to other mental disorder: Secondary | ICD-10-CM

## 2016-06-12 DIAGNOSIS — F99 Mental disorder, not otherwise specified: Secondary | ICD-10-CM

## 2016-06-12 DIAGNOSIS — Z765 Malingerer [conscious simulation]: Secondary | ICD-10-CM | POA: Insufficient documentation

## 2016-06-12 DIAGNOSIS — N529 Male erectile dysfunction, unspecified: Secondary | ICD-10-CM

## 2016-06-12 DIAGNOSIS — M25512 Pain in left shoulder: Secondary | ICD-10-CM

## 2016-06-12 DIAGNOSIS — F411 Generalized anxiety disorder: Secondary | ICD-10-CM

## 2016-06-12 MED ORDER — BUSPIRONE HCL 7.5 MG PO TABS: 7.5000 mg | ORAL_TABLET | Freq: Two times a day (BID) | ORAL | 5 refills | Status: DC

## 2016-06-12 MED ORDER — CELECOXIB 200 MG PO CAPS: ORAL_CAPSULE | ORAL | 5 refills | Status: DC

## 2016-06-12 MED ORDER — SILDENAFIL CITRATE 20 MG PO TABS: ORAL_TABLET | ORAL | 11 refills | Status: DC

## 2016-06-12 NOTE — Progress Notes (Signed)
HPI:                                                                Jacob Mcmillan is a 51 y.o. male who presents to [facility]: Primary Care Sports Medicine today for anxiety follow-up  Anxiety: patient has been taking Buspar 7.5mg  occasionally. He states that it gave him a fever of 102 and makes him feel hyperactive. Patient admits to purchasing xanax on the street and is taking 0.5 mg nightly to sleep. Denies depression/anhedonia. Denies SI/HI. Denies AH/VH.  He is requesting refills of his Sildenafil and Celebrex.  Past Medical History:  Diagnosis Date  . Anxiety   . CHD (congenital heart disease)   . Clotting disorder (HCC)   . Heart murmur   . History of open heart surgery   . Insomnia    Past Surgical History:  Procedure Laterality Date  . ARTERY REPAIR     as a child  . BACK SURGERY    . SPINE SURGERY     Social History  Substance Use Topics  . Smoking status: Never Smoker  . Smokeless tobacco: Never Used  . Alcohol use 1.2 oz/week    2 Cans of beer per week   family history includes Alcohol abuse in his father; Depression in his mother and sister; Hyperlipidemia in his father and mother; Hypertension in his father; Melanoma in his sister.  ROS: negative except as noted in the HPI  Medications: Current Outpatient Prescriptions  Medication Sig Dispense Refill  . busPIRone (BUSPAR) 7.5 MG tablet Take 1 tablet (7.5 mg total) by mouth 2 (two) times daily. 60 tablet 1  . celecoxib (CELEBREX) 200 MG capsule One to 2 tablets by mouth daily as needed for pain. 60 capsule 2  . CIALIS 5 MG tablet Take 1 tablet (5 mg total) by mouth daily. 30 tablet 3  . diclofenac sodium (VOLTAREN) 1 % GEL Apply 4 g topically 4 (four) times daily. To affected joint. 100 g 11  . sildenafil (REVATIO) 20 MG tablet Take 1-2 tabs 1 hour prior to sexual activity 30 tablet 11   No current facility-administered medications for this visit.    Allergies  Allergen  Reactions  . Amoxicillin Swelling    Patient states amoxicillin causes swelling one time. Has had regular penicillin multiple times since then without reaction.  . Codeine     "throw up"       Objective:  BP 129/85   Pulse (!) 106   Wt 180 lb (81.6 kg)   BMI 25.83 kg/m  Gen: well-groomed, cooperative, not ill-appearing, no distress Pulm: Normal work of breathing, normal phonation Neuro: alert and oriented x 3, EOM's intact, normal tone, no tremor MSK: moving all extremities, normal gait and station, no peripheral edema Psych: good eye contact, normal affect, euthymic mood, normal speech and thought content    No results found for this or any previous visit (from the past 72 hour(s)). No results found.    Assessment and Plan: 51 y.o. male with   1. Pain of left sternoclavicular joint - celecoxib (CELEBREX) 200 MG capsule; One to 2 tablets by mouth daily as needed for pain.  Dispense: 60 capsule; Refill: 5 - follow-up with Sports Medicine  2. GAD (generalized  anxiety disorder), Insomnia - patient has declined SSRI/SNRI in the past because he is concerned about sexual side effects - declines referral to counseling - declines Trazodone - patient lacks coping skills and is not open to solutions for his anxiety other than Xanax, which he is obtaining by illegal means - busPIRone (BUSPAR) 7.5 MG tablet; Take 1 tablet (7.5 mg total) by mouth 2 (two) times daily.  Dispense: 60 tablet; Refill: 5  3. Vasculogenic erectile dysfunction, unspecified vasculogenic erectile dysfunction type - sildenafil (REVATIO) 20 MG tablet; Take 1-5 tablets by mouth as needed 1 hour prior to sexual activity  Dispense: 50 tablet; Refill: 11  Patient education and anticipatory guidance given Patient agrees with treatment plan Follow-up in 6 months or sooner as needed  Levonne Hubert PA-C

## 2016-06-12 NOTE — Patient Instructions (Addendum)
-   Take paper prescriptions to [facility] in [place] [address] - Alternatively,visit [email or web address] to find cheapest drug options

## 2016-06-15 ENCOUNTER — Ambulatory Visit (INDEPENDENT_AMBULATORY_CARE_PROVIDER_SITE_OTHER): Payer: 59

## 2016-06-15 DIAGNOSIS — M19012 Primary osteoarthritis, left shoulder: Secondary | ICD-10-CM

## 2016-06-15 DIAGNOSIS — M25512 Pain in left shoulder: Secondary | ICD-10-CM

## 2016-06-15 DIAGNOSIS — M19011 Primary osteoarthritis, right shoulder: Secondary | ICD-10-CM

## 2017-01-08 ENCOUNTER — Ambulatory Visit (INDEPENDENT_AMBULATORY_CARE_PROVIDER_SITE_OTHER): Payer: 59

## 2017-01-08 ENCOUNTER — Encounter: Payer: Self-pay | Admitting: Physician Assistant

## 2017-01-08 ENCOUNTER — Ambulatory Visit (INDEPENDENT_AMBULATORY_CARE_PROVIDER_SITE_OTHER): Payer: 59 | Admitting: Physician Assistant

## 2017-01-08 VITALS — BP 138/85 | HR 90 | Temp 97.9°F | Wt 177.0 lb

## 2017-01-08 DIAGNOSIS — M25532 Pain in left wrist: Secondary | ICD-10-CM

## 2017-01-08 DIAGNOSIS — R2232 Localized swelling, mass and lump, left upper limb: Secondary | ICD-10-CM

## 2017-01-08 DIAGNOSIS — N529 Male erectile dysfunction, unspecified: Secondary | ICD-10-CM | POA: Diagnosis not present

## 2017-01-08 DIAGNOSIS — X58XXXD Exposure to other specified factors, subsequent encounter: Secondary | ICD-10-CM | POA: Diagnosis not present

## 2017-01-08 DIAGNOSIS — S62397D Other fracture of fifth metacarpal bone, left hand, subsequent encounter for fracture with routine healing: Secondary | ICD-10-CM | POA: Diagnosis not present

## 2017-01-08 DIAGNOSIS — M7989 Other specified soft tissue disorders: Secondary | ICD-10-CM | POA: Insufficient documentation

## 2017-01-08 MED ORDER — TADALAFIL 5 MG PO TABS: 5.0000 mg | ORAL_TABLET | Freq: Every day | ORAL | 1 refills | Status: DC | PRN

## 2017-01-08 NOTE — Assessment & Plan Note (Addendum)
X-rays are unremarkable with the exception of an old fifth metacarpal fracture and distal radius fracture, healed. Ultrasound does show a fourth extensor compartment tenosynovitis with tendon sheath effusion. Aspiration, injection. Fluid was somewhat cloudy, adding a crystal analysis. Hand was strapped with a compressive dressing. Return to see me in 1 month.

## 2017-01-08 NOTE — Progress Notes (Signed)
HPI:                                                                Jacob Mcmillan is a 51 y.o. male who [facility] today for left hand swelling  He reports intermittent hand swelling and stiffness x 2 months. States he noted a "knot" that developed when he woke up today. Endorses decreased grip strength. He does endorse intermittent fevers of 102. Denies known injury or trauma. Denies redness or warmth. 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   He is also requesting refill of his Cialis.  Past Medical History:  Diagnosis Date  . Anxiety   . CHD (congenital heart disease)   . Clotting disorder (HCC)   . Heart murmur   . History of open heart surgery   . Insomnia    Past Surgical History:  Procedure Laterality Date  . ARTERY REPAIR     as a child  . BACK SURGERY    . SPINE SURGERY     Social History   Tobacco Use  . Smoking status: Never Smoker  . Smokeless tobacco: Never Used  Substance Use Topics  . Alcohol use: Yes    Alcohol/week: 1.2 oz    Types: 2 Cans of beer per week   family history includes Alcohol abuse in his father; Depression in his mother and sister; Hyperlipidemia in his father and mother; Hypertension in his father; Melanoma in his sister.  ROS: negative except as noted in the HPI  Medications: Current Outpatient Medications  Medication Sig Dispense Refill  . celecoxib (CELEBREX) 200 MG capsule One to 2 tablets by mouth daily as needed for pain. 60 capsule 5  . sildenafil (REVATIO) 20 MG tablet Take 1-5 tablets by mouth as needed 1 hour prior to sexual activity 50 tablet 11  . tadalafil (CIALIS) 5 MG tablet Take 1 tablet (5 mg total) daily as needed by mouth for erectile dysfunction. 30 tablet 1   No current facility-administered medications for this visit.    Allergies  Allergen Reactions  . Amoxicillin Swelling    Patient states amoxicillin causes swelling one time.  Has had regular penicillin multiple times since then without reaction.  . Codeine     "throw up"       Objective:  BP 138/85   Pulse 90   Temp 97.9 F (36.6 C) (Oral)   Wt 177 lb (80.3 kg)   BMI 25.40 kg/m  Gen:  alert, not ill-appearing, no distress, appropriate for age HEENT: head normocephalic without obvious abnormality, conjunctiva and cornea clear, trachea midline Pulm: Normal work of breathing, normal phonation Neuro: alert and oriented x 3, no tremor MSK: dorsal aspect of left hand and wrist visibly swollen, palpable nodule over the left fourth extensor compartment, no deformities, grip strength 4/5 left hand Skin: intact, no rashes on exposed skin, no jaundice, no cyanosis     No results found for this or any previous visit (from the past 72 hour(s)). No results found.    Assessment and Plan: 51 y.o. male with   1. Localized swelling on left hand - acute onset hand/wrist swelling and pain suspicious for gout/pseudogout. For this reason Sports Medicine was consulted -  DG Wrist Complete Left - DG Hand Complete Left  2. Organic erectile dysfunction - tadalafil (CIALIS) 5 MG tablet; Take 1 tablet (5 mg total) daily as needed by mouth for erectile dysfunction.  Dispense: 30 tablet; Refill: 1   Patient education and anticipatory guidance given Patient agrees with treatment plan Follow-up with Sports Medicine in 4 weeks or sooner as needed if symptoms worsen or fail to improve  Levonne Hubertharley E. Cummings PA-C

## 2017-01-08 NOTE — Progress Notes (Signed)
   Subjective:    I'm seeing this patient as a consultation for: Gena Frayharley Cummings, PA-C  CC: Left hand swelling  HPI: This is a pleasant 51 year old male, he had some hand discomfort for years but overnight he developed severe swelling, pain over the dorsum of his left hand with radiation and extension to the fingers.  Symptoms are severe, worsening.  There is some warmth.  Past medical history, Surgical history, Family history not pertinant except as noted below, Social history, Allergies, and medications have been entered into the medical record, reviewed, and no changes needed.   Review of Systems: No headache, visual changes, nausea, vomiting, diarrhea, constipation, dizziness, abdominal pain, skin rash, fevers, chills, night sweats, weight loss, swollen lymph nodes, body aches, joint swelling, muscle aches, chest pain, shortness of breath, mood changes, visual or auditory hallucinations.   Objective:   General: Well Developed, well nourished, and in no acute distress.  Neuro:  Extra-ocular muscles intact, able to move all 4 extremities, sensation grossly intact.  Deep tendon reflexes tested were normal. Psych: Alert and oriented, mood congruent with affect. ENT:  Ears and nose appear unremarkable.  Hearing grossly normal. Neck: Unremarkable overall appearance, trachea midline.  No visible thyroid enlargement. Eyes: Conjunctivae and lids appear unremarkable.  Pupils equal and round. Skin: Warm and dry, no rashes noted.  Cardiovascular: Pulses palpable, no extremity edema. Left hand: Swollen, tender to palpation with fluctuance over the fourth extensor compartment without a squeaker sign, neurovascularly intact distally.  Sent for x-rays, personally reviewed, old distal radius fracture and fifth metacarpal fracture and widespread degenerative changes, nothing acute.  Procedure: Real-time Ultrasound Guided aspiration/injection of left fourth extensor compartment Device: GE Logiq E    Verbal informed consent obtained.  Time-out conducted.  Noted no overlying erythema, induration, or other signs of local infection.  Skin prepped in a sterile fashion.  Local anesthesia: Topical Ethyl chloride.  With sterile technique and under real time ultrasound guidance: I advanced a 25-gauge needle into the fourth extensor compartment, I then had the patient uses right hand to milk fluid from the dorsum of the wrist towards the hand, I was able to aspirate almost a full milliliter of straw-colored cloudy fluid, syringe switched in 1 cc kenalog 40, 1 cc lidocaine injected easily into the fourth dorsal extensor compartment. Completed without difficulty  Pain immediately resolved suggesting accurate placement of the medication.  Advised to call if fevers/chills, erythema, induration, drainage, or persistent bleeding.  Images permanently stored and available for review in the ultrasound unit.  Impression: Technically successful ultrasound guided injection.  The hand was then strapped with a compressive dressing.  Impression and Recommendations:   This case required medical decision making of moderate complexity.  Swelling of left hand X-rays are unremarkable with the exception of an old fifth metacarpal fracture and distal radius fracture, healed. Ultrasound does show a fourth extensor compartment tenosynovitis with tendon sheath effusion. Aspiration, injection. Fluid was somewhat cloudy, adding a crystal analysis. Hand was strapped with a compressive dressing. Return to see me in 1 month.  __________________________________________ Ihor Austinhomas J. Benjamin Stainhekkekandam, M.D., ABFM., CAQSM. Primary Care and Sports Medicine [facility]  Adjunct Instructor of Family Medicine  [employer]

## 2017-01-09 LAB — SYNOVIAL FLUID, CRYSTAL

## 2017-01-11 ENCOUNTER — Encounter: Payer: Self-pay | Admitting: Physician Assistant

## 2017-02-05 ENCOUNTER — Ambulatory Visit: Payer: 59 | Admitting: Sports Medicine

## 2017-02-05 DIAGNOSIS — Z0189 Encounter for other specified special examinations: Secondary | ICD-10-CM

## 2017-03-09 ENCOUNTER — Encounter: Payer: Self-pay | Admitting: Sports Medicine

## 2017-03-09 ENCOUNTER — Ambulatory Visit: Payer: 59 | Admitting: Sports Medicine

## 2017-03-09 DIAGNOSIS — M19012 Primary osteoarthritis, left shoulder: Secondary | ICD-10-CM

## 2017-03-09 MED ORDER — MELOXICAM 15 MG PO TABS: ORAL_TABLET | ORAL | 3 refills | Status: DC

## 2017-03-09 NOTE — Assessment & Plan Note (Signed)
Injection as above. Sternoclavicular joint that was injected in the past is not doing quite as bad today. He does have similar symptoms on the right side, adding meloxicam, rehab exercises. Return in 1 month.

## 2017-03-09 NOTE — Progress Notes (Signed)
Subjective:    I'm seeing this patient as a consultation for: Gena Fray, PA-C  CC: Left shoulder pain  HPI: This is a very pleasant 52 year old male, he has known acromioclavicular and left sternoclavicular osteoarthritis that responded well to an injection in the past.  Now having a recurrence of shoulder pain, bilateral, left far worse than right and focused mostly over the acromioclavicular joint.  No radiation, worse with reaching across his chest.  I reviewed the past medical history, family history, social history, surgical history, and allergies today and no changes were needed.  Please see the problem list section below in epic for further details.  Past Medical History: Past Medical History:  Diagnosis Date  . Anxiety   . CHD (congenital heart disease)   . Clotting disorder (HCC)   . Heart murmur   . History of open heart surgery   . Insomnia    Past Surgical History: Past Surgical History:  Procedure Laterality Date  . ARTERY REPAIR     as a child  . BACK SURGERY    . SPINE SURGERY     Social History: Social History   Socioeconomic History  . Marital status: Single    Spouse name: None  . Number of children: None  . Years of education: None  . Highest education level: None  Social Needs  . Financial resource strain: None  . Food insecurity - worry: None  . Food insecurity - inability: None  . Transportation needs - medical: None  . Transportation needs - non-medical: None  Occupational History  . None  Tobacco Use  . Smoking status: Never Smoker  . Smokeless tobacco: Never Used  Substance and Sexual Activity  . Alcohol use: Yes    Alcohol/week: 1.2 oz    Types: 2 Cans of beer per week  . Drug use: No  . Sexual activity: Yes    Birth control/protection: None    Comment: Girlfriend  Other Topics Concern  . None  Social History Narrative  . None   Family History: Family History  Problem Relation Age of Onset  . Depression Mother   .  Hyperlipidemia Mother   . Hypertension Father   . Hyperlipidemia Father   . Alcohol abuse Father   . Depression Sister   . Melanoma Sister    Allergies: Allergies  Allergen Reactions  . Amoxicillin Swelling    Patient states amoxicillin causes swelling one time. Has had regular penicillin multiple times since then without reaction.  . Codeine     "throw up"   Medications: See med rec.  Review of Systems: No headache, visual changes, nausea, vomiting, diarrhea, constipation, dizziness, abdominal pain, skin rash, fevers, chills, night sweats, weight loss, swollen lymph nodes, body aches, joint swelling, muscle aches, chest pain, shortness of breath, mood changes, visual or auditory hallucinations.   Objective:   General: Well Developed, well nourished, and in no acute distress.  Neuro:  Extra-ocular muscles intact, able to move all 4 extremities, sensation grossly intact.  Deep tendon reflexes tested were normal. Psych: Alert and oriented, mood congruent with affect. ENT:  Ears and nose appear unremarkable.  Hearing grossly normal. Neck: Unremarkable overall appearance, trachea midline.  No visible thyroid enlargement. Eyes: Conjunctivae and lids appear unremarkable.  Pupils equal and round. Skin: Warm and dry, no rashes noted.  Cardiovascular: Pulses palpable, no extremity edema. Bilateral shoulders: Inspection reveals no abnormalities, atrophy or asymmetry. Tender to palpation over the acromioclavicular joint, left far worse than right. ROM  is full in all planes. Rotator cuff strength normal throughout. No signs of impingement with negative Neer and Hawkin's tests, empty can. Speeds and Yergason's tests normal. No labral pathology noted with negative Obrien's, negative crank, negative clunk, and good stability. Normal scapular function observed. No painful arc and no drop arm sign. No apprehension sign.  Procedure: Real-time Ultrasound Guided Injection of left  acromioclavicular joint Device: GE Logiq E  Verbal informed consent obtained.  Time-out conducted.  Noted no overlying erythema, induration, or other signs of local infection.  Skin prepped in a sterile fashion.  Local anesthesia: Topical Ethyl chloride.  With sterile technique and under real time ultrasound guidance: 1/2 cc kenalog 40, 1/2 cc lidocaine injected easily Completed without difficulty  Pain immediately resolved suggesting accurate placement of the medication.  Advised to call if fevers/chills, erythema, induration, drainage, or persistent bleeding.  Images permanently stored and available for review in the ultrasound unit.  Impression: Technically successful ultrasound guided injection.  Impression and Recommendations:   This case required medical decision making of moderate complexity.  Arthritis of left acromioclavicular joint Injection as above. Sternoclavicular joint that was injected in the past is not doing quite as bad today. He does have similar symptoms on the right side, adding meloxicam, rehab exercises. Return in 1 month. ___________________________________________ Ihor Austinhomas J. Benjamin Stainhekkekandam, M.D., ABFM., CAQSM. Primary Care and Sports Medicine [facility]  Adjunct Instructor of Family Medicine  [employer]

## 2017-03-23 ENCOUNTER — Other Ambulatory Visit: Payer: Self-pay

## 2017-03-23 ENCOUNTER — Encounter (HOSPITAL_COMMUNITY): Payer: Self-pay

## 2017-03-23 ENCOUNTER — Emergency Department (HOSPITAL_COMMUNITY): Payer: 59

## 2017-03-23 ENCOUNTER — Emergency Department (HOSPITAL_COMMUNITY)
Admission: EM | Admit: 2017-03-23 | Discharge: 2017-03-23 | Disposition: A | Discharge status: Home / Self Care | Payer: 59 | Attending: Emergency Medicine | Admitting: Emergency Medicine

## 2017-03-23 DIAGNOSIS — J069 Acute upper respiratory infection, unspecified: Secondary | ICD-10-CM | POA: Insufficient documentation

## 2017-03-23 DIAGNOSIS — J029 Acute pharyngitis, unspecified: Secondary | ICD-10-CM | POA: Diagnosis present

## 2017-03-23 LAB — RAPID STREP SCREEN (MED CTR MEBANE ONLY): Streptococcus, Group A Screen (Direct): NEGATIVE

## 2017-03-23 LAB — INFLUENZA PANEL BY PCR (TYPE A & B)
INFLBPCR: NEGATIVE
Influenza A By PCR: NEGATIVE

## 2017-03-23 MED ORDER — BENZONATATE 100 MG PO CAPS: 100.0000 mg | ORAL_CAPSULE | Freq: Three times a day (TID) | ORAL | 0 refills | Status: DC | PRN

## 2017-03-23 MED ORDER — ALBUTEROL SULFATE HFA 108 (90 BASE) MCG/ACT IN AERS
2.0000 | INHALATION_SPRAY | RESPIRATORY_TRACT | 0 refills | Status: DC | PRN
Start: 2017-03-23 — End: 2017-06-24

## 2017-03-23 NOTE — ED Triage Notes (Signed)
Patient c/o generalized body aches, nasal congestion, and productive cough with white/yellow/green sputum x 4 days.

## 2017-03-23 NOTE — ED Provider Notes (Signed)
[facility]-EMERGENCY DEPT Provider Note   CSN: [id number] Arrival date & time: 03/23/17  0745     History   Chief Complaint Chief Complaint  Patient presents with  . Nasal Congestion  . Cough  . Generalized Body Aches    HPI GABRIEL CONRY is a 52 y.o. male.  HPI  Pt was seen at 1015.  Per pt, c/o gradual onset and persistence of constant sore throat, runny/stuffy nose, sinus congestion, and cough for the past 4 days.  Denies fevers, no rash, no CP/SOB, no N/V/D, no abd pain.    Past Medical History:  Diagnosis Date  . Anxiety   . CHD (congenital heart disease)   . Clotting disorder (HCC)   . Heart murmur   . History of open heart surgery   . Insomnia     Patient Active Problem List   Diagnosis Date Noted  . Arthritis of left acromioclavicular joint 03/09/2017  . Swelling of left hand 01/08/2017  . Insomnia due to other mental disorder 06/12/2016  . Drug-seeking behavior 06/12/2016  . White coat syndrome without diagnosis of hypertension 05/28/2016  . Attention deficit hyperactivity disorder (ADHD), combined type 04/08/2016  . GAD (generalized anxiety disorder) 04/08/2016  . Benign prostatic hyperplasia with post-void dribbling 04/08/2016  . Organic erectile dysfunction 04/08/2016  . Elevated blood pressure reading 04/08/2016  . Pain of left sternoclavicular joint 02/18/2016  . Impingement syndrome of left shoulder 02/18/2016    Past Surgical History:  Procedure Laterality Date  . ARTERY REPAIR     as a child  . BACK SURGERY    . SPINE SURGERY         Home Medications    Prior to Admission medications   Medication Sig Start Date End Date Taking? Authorizing Provider  sildenafil (REVATIO) 20 MG tablet Take 1-5 tablets by mouth as needed 1 hour prior to sexual activity 06/12/16  Yes Carlis Stable, PA-C  tadalafil (CIALIS) 5 MG tablet Take 1 tablet (5 mg total) daily as needed by mouth for erectile dysfunction.  01/08/17  Yes Carlis Stable, PA-C  meloxicam (MOBIC) 15 MG tablet One tab PO qAM with breakfast for 2 weeks, then daily prn pain. Patient not taking: Reported on 03/23/2017 03/09/17   Monica Becton, MD    Family History Family History  Problem Relation Age of Onset  . Depression Mother   . Hyperlipidemia Mother   . Hypertension Father   . Hyperlipidemia Father   . Alcohol abuse Father   . Depression Sister   . Melanoma Sister     Social History Social History   Tobacco Use  . Smoking status: Never Smoker  . Smokeless tobacco: Never Used  Substance Use Topics  . Alcohol use: Yes    Alcohol/week: 1.2 oz    Types: 2 Cans of beer per week  . Drug use: No     Allergies   Amoxicillin and Codeine   Review of Systems Review of Systems ROS: Statement: All systems negative except as marked or noted in the HPI; Constitutional: Negative for fever and chills. ; ; Eyes: Negative for eye pain, redness and discharge. ; ; ENMT: Negative for ear pain, hoarseness, +nasal congestion, sinus pressure and sore throat. ; ; Cardiovascular: Negative for chest pain, palpitations, diaphoresis, dyspnea and peripheral edema. ; ; Respiratory: +cough. Negative for wheezing and stridor. ; ; Gastrointestinal: Negative for nausea, vomiting, diarrhea, abdominal pain, blood in stool, hematemesis, jaundice and rectal bleeding. . ; ;  Genitourinary: Negative for dysuria, flank pain and hematuria. ; ; Musculoskeletal: Negative for back pain and neck pain. Negative for swelling and trauma.; ; Skin: Negative for pruritus, rash, abrasions, blisters, bruising and skin lesion.; ; Neuro: Negative for headache, lightheadedness and neck stiffness. Negative for weakness, altered level of consciousness, altered mental status, extremity weakness, paresthesias, involuntary movement, seizure and syncope.       Physical Exam Updated Vital Signs BP (!) 145/96   Pulse (!) 114   Temp 98.4 F (36.9 C)  (Oral)   Resp 18   Ht 5\' 11"  (1.803 m)   Wt 83.5 kg (184 lb)   SpO2 95%   BMI 25.66 kg/m   Physical Exam 1020: Physical examination:  Nursing notes reviewed; Vital signs and O2 SAT reviewed;  Constitutional: Well developed, Well nourished, Well hydrated, In no acute distress; Head:  Normocephalic, atraumatic; Eyes: EOMI, PERRL, No scleral icterus; ENMT: TM's clear bilat. +edemetous nasal turbinates bilat with clear rhinorrhea. Mouth and pharynx without lesions. No tonsillar exudates. No intra-oral edema. No submandibular or sublingual edema. No hoarse voice, no drooling, no stridor. No pain with manipulation of larynx. No trismus.  Mouth and pharynx normal, Mucous membranes moist; Neck: Supple, Full range of motion, No lymphadenopathy; Cardiovascular: Regular rate and rhythm, No gallop; Respiratory: Breath sounds clear & equal bilaterally, No wheezes.  Speaking full sentences with ease, Normal respiratory effort/excursion; Chest: Nontender, Movement normal; Abdomen: Soft, Nontender, Nondistended, Normal bowel sounds; Genitourinary: No CVA tenderness; Extremities: Pulses normal, No tenderness, No edema, No calf edema or asymmetry.; Neuro: AA&Ox3, Major CN grossly intact.  Speech clear. No gross focal motor or sensory deficits in extremities. Climbs on and off stretcher easily by himself. Gait steady..; Skin: Color normal, Warm, Dry.   ED Treatments / Results  Labs (all labs ordered are listed, but only abnormal results are displayed)   EKG  EKG Interpretation None       Radiology   Procedures Procedures (including critical care time)  Medications Ordered in ED Medications - No data to display   Initial Impression / Assessment and Plan / ED Course  I have reviewed the triage vital signs and the nursing notes.  Pertinent labs & imaging results that were available during my care of the patient were reviewed by me and considered in my medical decision making (see chart for  details).  MDM Reviewed: previous chart, nursing note and vitals Reviewed previous: labs Interpretation: labs and x-ray   Results for orders placed or performed during the hospital encounter of 03/23/17  Rapid strep screen  Result Value Ref Range   Streptococcus, Group A Screen (Direct) NEGATIVE NEGATIVE  Influenza panel by PCR (type A & B)  Result Value Ref Range   Influenza A By PCR NEGATIVE NEGATIVE   Influenza B By PCR NEGATIVE NEGATIVE   Dg Chest 2 View Result Date: 03/23/2017 CLINICAL DATA:  Cough. EXAM: CHEST  2 VIEW COMPARISON:  Radiographs of May 27, 2015. FINDINGS: The heart size and mediastinal contours are within normal limits. Both lungs are clear. No pneumothorax or pleural effusion is noted. The visualized skeletal structures are unremarkable. IMPRESSION: No active cardiopulmonary disease. Electronically Signed   By: Lupita Raider, M.D.   On: 03/23/2017 10:51    1300:  Workup reassuring. Tx symptomatically at this time. Dx and testing d/w pt.  Questions answered.  Verb understanding, agreeable to d/c home with outpt f/u.    Final Clinical Impressions(s) / ED Diagnoses   Final diagnoses:  None    ED Discharge Orders    None       Samuel JesterMcManus, Sibbie Flammia, DO 03/25/17 2331

## 2017-03-23 NOTE — Discharge Instructions (Signed)
Take over the counter tylenol and ibuprofen, as directed on packaging, as needed for discomfort.  Gargle with warm water several times per day to help with discomfort.  May also use over the counter sore throat pain medicines such as chloraseptic or sucrets, as directed on packaging, as needed for discomfort.  Take the prescriptions as directed. Call your regular medical doctor today to schedule a follow up appointment this week.  Return to the Emergency Department immediately if worsening.

## 2017-03-25 LAB — CULTURE, GROUP A STREP (THRC)

## 2017-04-06 ENCOUNTER — Ambulatory Visit: Payer: 59 | Admitting: Sports Medicine

## 2017-04-06 DIAGNOSIS — Z0189 Encounter for other specified special examinations: Secondary | ICD-10-CM

## 2017-06-24 ENCOUNTER — Telehealth: Payer: Self-pay

## 2017-06-24 ENCOUNTER — Ambulatory Visit (INDEPENDENT_AMBULATORY_CARE_PROVIDER_SITE_OTHER): Payer: 59 | Admitting: Physician Assistant

## 2017-06-24 ENCOUNTER — Ambulatory Visit (INDEPENDENT_AMBULATORY_CARE_PROVIDER_SITE_OTHER): Payer: 59

## 2017-06-24 ENCOUNTER — Encounter: Payer: Self-pay | Admitting: Physician Assistant

## 2017-06-24 VITALS — BP 149/84 | HR 103 | Temp 97.9°F | Wt 174.0 lb

## 2017-06-24 DIAGNOSIS — R61 Generalized hyperhidrosis: Secondary | ICD-10-CM

## 2017-06-24 DIAGNOSIS — Z113 Encounter for screening for infections with a predominantly sexual mode of transmission: Secondary | ICD-10-CM | POA: Diagnosis not present

## 2017-06-24 DIAGNOSIS — R509 Fever, unspecified: Secondary | ICD-10-CM | POA: Diagnosis not present

## 2017-06-24 DIAGNOSIS — R05 Cough: Secondary | ICD-10-CM | POA: Diagnosis not present

## 2017-06-24 DIAGNOSIS — Z9109 Other allergy status, other than to drugs and biological substances: Secondary | ICD-10-CM | POA: Insufficient documentation

## 2017-06-24 DIAGNOSIS — R000 Tachycardia, unspecified: Secondary | ICD-10-CM | POA: Diagnosis not present

## 2017-06-24 NOTE — Telephone Encounter (Signed)
Patient scheduled for today

## 2017-06-24 NOTE — Telephone Encounter (Signed)
Patient called stated that he is having night sweats , cold chills, high temperature, and vomiting going on. He wants to know if there is a blood test that can be done to find out whats wrong with him or do he need to go elsewhere. Please advise. Rhonda Cunningham,CMA
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
## 2017-06-28 NOTE — Progress Notes (Signed)
Chest x-ray was negative I need him to get his blood work done to proceed with the work-up

## 2017-07-02 LAB — RPR: RPR Ser Ql: NONREACTIVE

## 2017-07-02 LAB — HEPATITIS C ANTIBODY
Hepatitis C Ab: NONREACTIVE
SIGNAL TO CUT-OFF: 0.1 (ref <1.00)

## 2017-07-02 LAB — C. TRACHOMATIS/N. GONORRHOEAE RNA
C. trachomatis RNA, TMA: NOT DETECTED
N. gonorrhoeae RNA, TMA: NOT DETECTED

## 2017-07-04 NOTE — Progress Notes (Signed)
STI screening is negative No lab abnormality to explain patient's fevers and weight loss  Proceed with echocardiogram If this is normal, I would recommend we get a chest CT

## 2017-07-08 LAB — COMPREHENSIVE METABOLIC PANEL
AG Ratio: 1.4 (calc) (ref 1.0–2.5)
ALT: 14 U/L (ref 9–46)
AST: 17 U/L (ref 10–35)
Albumin: 4.5 g/dL (ref 3.6–5.1)
Alkaline phosphatase (APISO): 78 U/L (ref 40–115)
BUN: 19 mg/dL (ref 7–25)
CO2: 26 mmol/L (ref 20–32)
Calcium: 9.9 mg/dL (ref 8.6–10.3)
Chloride: 103 mmol/L (ref 98–110)
Creat: 1.15 mg/dL (ref 0.70–1.33)
Globulin: 3.3 g/dL (calc) (ref 1.9–3.7)
Glucose, Bld: 117 mg/dL — ABNORMAL HIGH (ref 65–99)
Potassium: 4.5 mmol/L (ref 3.5–5.3)
Sodium: 140 mmol/L (ref 135–146)
Total Bilirubin: 0.4 mg/dL (ref 0.2–1.2)
Total Protein: 7.8 g/dL (ref 6.1–8.1)

## 2017-07-08 LAB — CBC WITH DIFFERENTIAL/PLATELET
Basophils Absolute: 64 cells/uL (ref 0–200)
Basophils Relative: 0.6 %
Eosinophils Absolute: 159 cells/uL (ref 15–500)
Eosinophils Relative: 1.5 %
HEMATOCRIT: 43.4 % (ref 38.5–50.0)
Hemoglobin: 14.4 g/dL (ref 13.2–17.1)
Lymphs Abs: 2088 cells/uL (ref 850–3900)
MCH: 26.4 pg — ABNORMAL LOW (ref 27.0–33.0)
MCHC: 33.2 g/dL (ref 32.0–36.0)
MCV: 79.5 fL — AB (ref 80.0–100.0)
MPV: 9.5 fL (ref 7.5–12.5)
Monocytes Relative: 11.3 %
NEUTROS PCT: 66.9 %
Neutro Abs: 7091 cells/uL (ref 1500–7800)
Platelets: 287 10*3/uL (ref 140–400)
RBC: 5.46 10*6/uL (ref 4.20–5.80)
RDW: 14.2 % (ref 11.0–15.0)
Total Lymphocyte: 19.7 %
WBC: 10.6 10*3/uL (ref 3.8–10.8)
WBCMIX: 1198 {cells}/uL — AB (ref 200–950)

## 2017-07-08 LAB — HEMOGLOBIN A1C
EAG (MMOL/L): 6 (calc)
Hgb A1c MFr Bld: 5.4 % of total Hgb (ref <5.7)
Mean Plasma Glucose: 108 (calc)

## 2017-07-08 LAB — CULTURE BLOOD MANUAL
MICRO NUMBER 7002: 90572621
Result: NO GROWTH

## 2017-07-08 LAB — QUANTIFERON-TB GOLD PLUS
NIL: 0.09 [IU]/mL
QuantiFERON-TB Gold Plus: NEGATIVE
TB1-NIL: <0 IU/mL
TB2-NIL: <0 IU/mL

## 2017-07-08 LAB — TSH+FREE T4: TSH W/REFLEX TO FT4: 2.5 mIU/L (ref 0.40–4.50)

## 2017-07-08 LAB — HIV ANTIBODY (ROUTINE TESTING W REFLEX): HIV: NONREACTIVE

## 2017-07-13 ENCOUNTER — Encounter: Payer: Self-pay | Admitting: Allergy and Immunology

## 2017-07-13 ENCOUNTER — Ambulatory Visit: Payer: 59 | Admitting: Allergy and Immunology

## 2017-07-13 VITALS — BP 128/88 | HR 100 | Temp 99.2°F | Resp 18 | Ht 67.5 in | Wt 174.8 lb

## 2017-07-13 DIAGNOSIS — J310 Chronic rhinitis: Secondary | ICD-10-CM | POA: Diagnosis not present

## 2017-07-13 DIAGNOSIS — R05 Cough: Secondary | ICD-10-CM | POA: Diagnosis not present

## 2017-07-13 DIAGNOSIS — J4530 Mild persistent asthma, uncomplicated: Secondary | ICD-10-CM | POA: Diagnosis not present

## 2017-07-13 DIAGNOSIS — R509 Fever, unspecified: Secondary | ICD-10-CM | POA: Diagnosis not present

## 2017-07-13 DIAGNOSIS — R053 Chronic cough: Secondary | ICD-10-CM | POA: Insufficient documentation

## 2017-07-13 HISTORY — DX: Mild persistent asthma, uncomplicated: J45.30

## 2017-07-13 MED ORDER — AZELASTINE-FLUTICASONE 137-50 MCG/ACT NA SUSP: 1.0000 | Freq: Two times a day (BID) | NASAL | 5 refills | Status: DC

## 2017-07-13 MED ORDER — FLUTICASONE PROPIONATE HFA 44 MCG/ACT IN AERO: 2.0000 | INHALATION_SPRAY | Freq: Two times a day (BID) | RESPIRATORY_TRACT | 4 refills | Status: DC

## 2017-07-13 MED ORDER — ALBUTEROL SULFATE HFA 108 (90 BASE) MCG/ACT IN AERS: 1.0000 | INHALATION_SPRAY | Freq: Four times a day (QID) | RESPIRATORY_TRACT | 1 refills | Status: DC | PRN

## 2017-07-13 MED ORDER — CARBINOXAMINE MALEATE 4 MG PO TABS: 4.0000 mg | ORAL_TABLET | Freq: Three times a day (TID) | ORAL | 5 refills | Status: DC | PRN

## 2017-07-13 NOTE — Assessment & Plan Note (Addendum)
   A prescription has been provided for carbinoxamine 4 mg every 8 hours as needed.  A prescription has been provided for Dymista (azelastine/fluticasone) nasal spray, 1 spray per nostril twice daily as needed. Proper nasal spray technique has been discussed and demonstrated.  Nasal saline lavage (i.e., NeilMed) is recommended as needed and prior to medicated nasal sprays.

## 2017-07-13 NOTE — Assessment & Plan Note (Signed)
The most common causes of chronic cough include the following: upper airway cough syndrome (UACS) which is caused by variety of rhinosinus conditions; asthma; gastroesophageal reflux disease (GERD); chronic bronchitis from cigarette smoking or other inhaled environmental irritants; non-asthmatic eosinophilic bronchitis; and bronchiectasis. In prospective studies, these conditions have accounted for up to 94% of the causes of chronic cough in immunocompetent adults. The history and physical examination suggest that his cough is multifactorial with contribution from bronchial hyperresponsiveness and possibly postnasal drainage. We will address these issues at this time.   A prescription has been provided for a flutter valve to be used as needed to break the coughing cycle.  Treatment plan as outlined above.    We will regroup in 3 months to assess treatment response and adjust therapy accordingly.

## 2017-07-13 NOTE — Assessment & Plan Note (Deleted)
   Immunocompetence will be assessed with labs.  The following labs have been ordered: CBC with differential, IgG, IgA and IgM, tetanus IgG titers, and pneumococcal IgG titers.  If pre-vaccination titers are low, post-vaccination titers will be drawn to assess response.    The patient will be called with further recommendations and follow-up instructions once the labs have returned. 

## 2017-07-13 NOTE — Patient Instructions (Addendum)
Mild persistent asthma Todays spirometry results, assessed while asymptomatic, suggest under-perception of bronchoconstriction.  A prescription has been provided for Flovent (fluticasone) 44 g,  2 inhalations twice a day. To maximize pulmonary deposition, a spacer has been provided along with instructions for its proper administration with an HFA inhaler.  A prescription has been provided for albuterol HFA, 1 to 2 inhalations every 6 hours if needed.  Subjective and objective measures of pulmonary function will be followed and the treatment plan will be adjusted accordingly.  Nonallergic rhinitis  A prescription has been provided for carbinoxamine 4 mg every 8 hours as needed.  A prescription has been provided for Dymista (azelastine/fluticasone) nasal spray, 1 spray per nostril twice daily as needed. Proper nasal spray technique has been discussed and demonstrated.  Nasal saline lavage (i.e., NeilMed) is recommended as needed and prior to medicated nasal sprays.  Cough, persistent The most common causes of chronic cough include the following: upper airway cough syndrome (UACS) which is caused by variety of rhinosinus conditions; asthma; gastroesophageal reflux disease (GERD); chronic bronchitis from cigarette smoking or other inhaled environmental irritants; non-asthmatic eosinophilic bronchitis; and bronchiectasis. In prospective studies, these conditions have accounted for up to 94% of the causes of chronic cough in immunocompetent adults. The history and physical examination suggest that his cough is multifactorial with contribution from bronchial hyperresponsiveness and possibly postnasal drainage. We will address these issues at this time.   A prescription has been provided for a flutter valve to be used as needed to break the coughing cycle.  Treatment plan as outlined above.    We will regroup in 3 months to assess treatment response and adjust therapy accordingly.  Fever of  unknown origin (FUO) Immunocompetence will be assessed with labs.  The following labs have been ordered: CBC with differential, IgG, IgA and IgM, tetanus IgG titers, and pneumococcal IgG titers.  If pre-vaccination titers are low, post-vaccination titers will be drawn to assess response.    The patient will be called with further recommendations and follow-up instructions once the labs have returned.  Chest x-ray on Jun 24, 2017 revealed no abnormalities.   Return in about 3 months (around 10/13/2017), or if symptoms worsen or fail to improve.

## 2017-07-13 NOTE — Assessment & Plan Note (Signed)
Todays spirometry results, assessed while asymptomatic, suggest under-perception of bronchoconstriction.  A prescription has been provided for Flovent (fluticasone) 44 g, 2 inhalations twice a day. To maximize pulmonary deposition, a spacer has been provided along with instructions for its proper administration with an HFA inhaler.  A prescription has been provided for albuterol HFA, 1 to 2 inhalations every 6 hours if needed.  Subjective and objective measures of pulmonary function will be followed and the treatment plan will be adjusted accordingly.

## 2017-07-13 NOTE — Progress Notes (Signed)
New Patient Note  RE: Jacob Mcmillan MRN: [id number] DOB: 16-Aug-1965 Date of Office Visit: 07/13/2017  Referring provider: Donzetta Kohut* Primary care provider: Carlis Stable, PA-C  Chief Complaint: Fever; Cough; and Wheezing   History of present illness: Jacob Mcmillan is a 52 y.o. male seen today in consultation requested by Carlis Stable, PA-C.  He complains of recurrent coughing episodes which started approximately 9 or 10 months ago.  The cough is described as a dry, hacking cough which is sometimes severe enough to cause him to almost vomit.  He has experienced episodes of wheezing but does not believe that he has experienced dyspnea or labored breathing.  He experiences nasal congestion and postnasal drainage.  In addition, he complains of recurrent fevers and profound fatigue.  He does not complain of discolored mucus production nor does he experience significant sinus pressure.  He believes that the constellation of symptoms may have been triggered by mold exposure after having moved into his apartment approximately 18 months ago.  He reports that the carpeting has been damaged with water and mold and has not been replaced.  He has tried multiple over-the-counter antihistamines without perceived symptom relief.  A chest x-ray from Jun 24, 2017 was read as normal.  Assessment and plan: Mild persistent asthma Todays spirometry results, assessed while asymptomatic, suggest under-perception of bronchoconstriction.  A prescription has been provided for Flovent (fluticasone) 44 g,  2 inhalations twice a day. To maximize pulmonary deposition, a spacer has been provided along with instructions for its proper administration with an HFA inhaler.  A prescription has been provided for albuterol HFA, 1 to 2 inhalations every 6 hours if needed.  Subjective and objective measures of pulmonary function will be followed and the treatment plan will be  adjusted accordingly.  Nonallergic rhinitis  A prescription has been provided for carbinoxamine 4 mg every 8 hours as needed.  A prescription has been provided for Dymista (azelastine/fluticasone) nasal spray, 1 spray per nostril twice daily as needed. Proper nasal spray technique has been discussed and demonstrated.  Nasal saline lavage (i.e., NeilMed) is recommended as needed and prior to medicated nasal sprays.  Cough, persistent The most common causes of chronic cough include the following: upper airway cough syndrome (UACS) which is caused by variety of rhinosinus conditions; asthma; gastroesophageal reflux disease (GERD); chronic bronchitis from cigarette smoking or other inhaled environmental irritants; non-asthmatic eosinophilic bronchitis; and bronchiectasis. In prospective studies, these conditions have accounted for up to 94% of the causes of chronic cough in immunocompetent adults. The history and physical examination suggest that his cough is multifactorial with contribution from bronchial hyperresponsiveness and possibly postnasal drainage. We will address these issues at this time.   A prescription has been provided for a flutter valve to be used as needed to break the coughing cycle.  Treatment plan as outlined above.    We will regroup in 3 months to assess treatment response and adjust therapy accordingly.  Fever of unknown origin (FUO) Immunocompetence will be assessed with labs.  The following labs have been ordered: CBC with differential, IgG, IgA and IgM, tetanus IgG titers, and pneumococcal IgG titers.  If pre-vaccination titers are low, post-vaccination titers will be drawn to assess response.    The patient will be called with further recommendations and follow-up instructions once the labs have returned.  Chest x-ray on Jun 24, 2017 revealed no abnormalities.   Meds ordered this encounter  Medications  . fluticasone (FLOVENT HFA) 44  MCG/ACT inhaler    Sig:  Inhale 2 puffs into the lungs 2 (two) times daily.    Dispense:  1 Inhaler    Refill:  4  . albuterol (PROVENTIL HFA;VENTOLIN HFA) 108 (90 Base) MCG/ACT inhaler    Sig: Inhale 1-2 puffs into the lungs every 6 (six) hours as needed for wheezing or shortness of breath.    Dispense:  18 g    Refill:  1  . Carbinoxamine Maleate 4 MG TABS    Sig: Take 1 tablet (4 mg total) by mouth every 8 (eight) hours as needed.    Dispense:  30 each    Refill:  5  . Azelastine-Fluticasone 137-50 MCG/ACT SUSP    Sig: Place 1 spray into the nose 2 (two) times daily.    Dispense:  23 g    Refill:  5    Diagnostics: Spirometry: FVC was 2.50 L and FEV1 was 2.06 L (56% predicted) with significant (380 mL) postbronchodilator improvement.  Please see scanned spirometry results for details. Epicutaneous testing: Negative despite a positive histamine control. Intradermal testing: Negative.    Physical examination: Blood pressure 128/88, pulse 100, temperature 99.2 F (37.3 C), temperature source Oral, resp. rate 18, height 5' 7.5" (1.715 m), weight 174 lb 12.8 oz (79.3 kg), SpO2 96 %.  General: Alert, interactive, in no acute distress. HEENT: TMs pearly gray, turbinates moderately edematous without discharge, post-pharynx mildly erythematous. Neck: Supple without lymphadenopathy. Lungs: Clear to auscultation without wheezing, rhonchi or rales. CV: Normal S1, S2 without murmurs. Abdomen: Nondistended, nontender. Skin: Warm and dry, without lesions or rashes. Extremities:  No clubbing, cyanosis or edema. Neuro:   Grossly intact.  Review of systems:  Review of systems negative except as noted in HPI / PMHx or noted below: Review of Systems  Constitutional: Negative.   HENT: Negative.   Eyes: Negative.   Respiratory: Negative.   Cardiovascular: Negative.   Gastrointestinal: Negative.   Genitourinary: Negative.   Musculoskeletal: Negative.   Skin: Negative.   Neurological: Negative.     Endo/Heme/Allergies: Negative.   Psychiatric/Behavioral: Negative.     Past medical history:  Past Medical History:  Diagnosis Date  . Anxiety   . CHD (congenital heart disease)   . Clotting disorder (HCC)   . Heart murmur   . History of open heart surgery   . Hypertension   . Insomnia   . Mild persistent asthma 07/13/2017    Past surgical history:  Past Surgical History:  Procedure Laterality Date  . ARTERY REPAIR     as a child  . BACK SURGERY    . SPINE SURGERY      Family history: Family History  Problem Relation Age of Onset  . Depression Mother   . Hyperlipidemia Mother   . Hypertension Father   . Hyperlipidemia Father   . Alcohol abuse Father   . Depression Sister   . Melanoma Sister   . Lung cancer Paternal Aunt   . Lung cancer Paternal Uncle   . Heart attack Maternal Uncle     Social history: Social History   Socioeconomic History  . Marital status: Single    Spouse name: Not on file  . Number of children: Not on file  . Years of education: Not on file  . Highest education level: Not on file  Occupational History  . Not on file  Social Needs  . Financial resource strain: Not on file  . Food insecurity:    Worry: Not on file  Inability: Not on file  . Transportation needs:    Medical: Not on file    Non-medical: Not on file  Tobacco Use  . Smoking status: Never Smoker  . Smokeless tobacco: Never Used  Substance and Sexual Activity  . Alcohol use: Yes    Alcohol/week: 1.2 oz    Types: 2 Cans of beer per week  . Drug use: Not Currently    Types: Marijuana  . Sexual activity: Yes    Birth control/protection: None    Comment: Girlfriend  Lifestyle  . Physical activity:    Days per week: Not on file    Minutes per session: Not on file  . Stress: Not on file  Relationships  . Social connections:    Talks on phone: Not on file    Gets together: Not on file    Attends religious service: Not on file    Active member of club or  organization: Not on file    Attends meetings of clubs or organizations: Not on file    Relationship status: Not on file  . Intimate partner violence:    Fear of current or ex partner: Not on file    Emotionally abused: Not on file    Physically abused: Not on file    Forced sexual activity: Not on file  Other Topics Concern  . Not on file  Social History Narrative  . Not on file   Environmental History: The patient lives in an apartment with carpeting throughout and central air/heat.  There is a dog in the home.  He is a non-smoker.  There is known mold/water damage in the apartment.  Allergies as of 07/13/2017      Reactions   Amoxicillin Swelling   Patient states amoxicillin causes swelling one time. Has had regular penicillin multiple times since then without reaction.   Codeine    "throw up"      Medication List        Accurate as of 07/13/17  1:04 PM. Always use your most recent med list.          albuterol 108 (90 Base) MCG/ACT inhaler Commonly known as:  PROVENTIL HFA;VENTOLIN HFA Inhale 1-2 puffs into the lungs every 6 (six) hours as needed for wheezing or shortness of breath.   Azelastine-Fluticasone 137-50 MCG/ACT Susp Place 1 spray into the nose 2 (two) times daily.   Carbinoxamine Maleate 4 MG Tabs Take 1 tablet (4 mg total) by mouth every 8 (eight) hours as needed.   fluticasone 44 MCG/ACT inhaler Commonly known as:  FLOVENT HFA Inhale 2 puffs into the lungs 2 (two) times daily.   meloxicam 15 MG tablet Commonly known as:  MOBIC One tab PO qAM with breakfast for 2 weeks, then daily prn pain.   sildenafil 20 MG tablet Commonly known as:  REVATIO Take 1-5 tablets by mouth as needed 1 hour prior to sexual activity   tadalafil 5 MG tablet Commonly known as:  CIALIS Take 1 tablet (5 mg total) daily as needed by mouth for erectile dysfunction.       Known medication allergies: Allergies  Allergen Reactions  . Amoxicillin Swelling    Patient states  amoxicillin causes swelling one time. Has had regular penicillin multiple times since then without reaction.  . Codeine     "throw up"    I appreciate the opportunity to take part in [place] care. Please do not hesitate to contact me with questions.  Sincerely,   R. Montez Morita  Verlin Fester, MD

## 2017-07-13 NOTE — Assessment & Plan Note (Signed)
Immunocompetence will be assessed with labs.  The following labs have been ordered: CBC with differential, IgG, IgA and IgM, tetanus IgG titers, and pneumococcal IgG titers.  If pre-vaccination titers are low, post-vaccination titers will be drawn to assess response.    The patient will be called with further recommendations and follow-up instructions once the labs have returned.  Chest x-ray on Jun 24, 2017 revealed no abnormalities.

## 2017-07-20 LAB — CBC WITH DIFFERENTIAL
Basophils Absolute: 0 10*3/uL (ref 0.0–0.2)
Basos: 0 %
EOS (ABSOLUTE): 0 10*3/uL (ref 0.0–0.4)
Eos: 0 %
Hematocrit: 39.8 % (ref 37.5–51.0)
Hemoglobin: 13.1 g/dL (ref 13.0–17.7)
Immature Grans (Abs): 0 10*3/uL (ref 0.0–0.1)
Immature Granulocytes: 0 %
Lymphocytes Absolute: 0.8 10*3/uL (ref 0.7–3.1)
Lymphs: 8 %
MCH: 26.4 pg — ABNORMAL LOW (ref 26.6–33.0)
MCHC: 32.9 g/dL (ref 31.5–35.7)
MCV: 80 fL (ref 79–97)
Monocytes Absolute: 1.1 10*3/uL — ABNORMAL HIGH (ref 0.1–0.9)
Monocytes: 10 %
Neutrophils Absolute: 9.2 10*3/uL — ABNORMAL HIGH (ref 1.4–7.0)
Neutrophils: 82 %
RBC: 4.96 x10E6/uL (ref 4.14–5.80)
RDW: 15.6 % — ABNORMAL HIGH (ref 12.3–15.4)
WBC: 11.3 10*3/uL — ABNORMAL HIGH (ref 3.4–10.8)

## 2017-07-20 LAB — STREP PNEUMONIAE 23 SEROTYPES IGG
Pneumo Ab Type 1*: <0.1 ug/mL — ABNORMAL LOW (ref >1.3)
Pneumo Ab Type 12 (12F)*: <0.1 ug/mL — ABNORMAL LOW (ref >1.3)
Pneumo Ab Type 14*: 0.1 ug/mL — ABNORMAL LOW (ref >1.3)
Pneumo Ab Type 17 (17F)*: 0.9 ug/mL — ABNORMAL LOW (ref >1.3)
Pneumo Ab Type 19 (19F)*: <0.1 ug/mL — ABNORMAL LOW (ref >1.3)
Pneumo Ab Type 2*: 0.1 ug/mL — ABNORMAL LOW (ref >1.3)
Pneumo Ab Type 20*: 0.1 ug/mL — ABNORMAL LOW (ref >1.3)
Pneumo Ab Type 22 (22F)*: <0.1 ug/mL — ABNORMAL LOW (ref >1.3)
Pneumo Ab Type 23 (23F)*: 0.1 ug/mL — ABNORMAL LOW (ref >1.3)
Pneumo Ab Type 26 (6B)*: <0.1 ug/mL — ABNORMAL LOW (ref >1.3)
Pneumo Ab Type 3*: <0.1 ug/mL — ABNORMAL LOW (ref >1.3)
Pneumo Ab Type 34 (10A)*: <0.1 ug/mL — ABNORMAL LOW (ref >1.3)
Pneumo Ab Type 4*: <0.1 ug/mL — ABNORMAL LOW (ref >1.3)
Pneumo Ab Type 43 (11A)*: 1.2 ug/mL — ABNORMAL LOW (ref >1.3)
Pneumo Ab Type 5*: <0.2 ug/mL — ABNORMAL LOW (ref >1.3)
Pneumo Ab Type 51 (7F)*: <0.1 ug/mL — ABNORMAL LOW (ref >1.3)
Pneumo Ab Type 54 (15B)*: <0.1 ug/mL — ABNORMAL LOW (ref >1.3)
Pneumo Ab Type 56 (18C)*: 0.3 ug/mL — ABNORMAL LOW (ref >1.3)
Pneumo Ab Type 57 (19A)*: 0.1 ug/mL — ABNORMAL LOW (ref >1.3)
Pneumo Ab Type 68 (9V)*: <0.1 ug/mL — ABNORMAL LOW (ref >1.3)
Pneumo Ab Type 70 (33F)*: 0.3 ug/mL — ABNORMAL LOW (ref >1.3)
Pneumo Ab Type 8*: 0.3 ug/mL — ABNORMAL LOW (ref >1.3)
Pneumo Ab Type 9 (9N)*: <0.1 ug/mL — ABNORMAL LOW (ref >1.3)

## 2017-07-20 LAB — IGG, IGA, IGM
IgA/Immunoglobulin A, Serum: 576 mg/dL — ABNORMAL HIGH (ref 90–386)
IgG (Immunoglobin G), Serum: 1060 mg/dL (ref 700–1600)
IgM (Immunoglobulin M), Srm: 29 mg/dL (ref 20–172)

## 2017-07-20 LAB — TETANUS ANTIBODY, IGG: Tetanus Ab, IgG: 3.91 IU/mL (ref <0.10)

## 2017-07-21 ENCOUNTER — Ambulatory Visit (HOSPITAL_BASED_OUTPATIENT_CLINIC_OR_DEPARTMENT_OTHER): Payer: 59

## 2017-08-03 ENCOUNTER — Encounter: Payer: Self-pay | Admitting: *Deleted

## 2017-08-09 ENCOUNTER — Ambulatory Visit (HOSPITAL_BASED_OUTPATIENT_CLINIC_OR_DEPARTMENT_OTHER): Admission: RE | Admit: 2017-08-09 | Payer: 59 | Source: Ambulatory Visit

## 2017-08-23 ENCOUNTER — Telehealth: Payer: Self-pay

## 2017-08-23 NOTE — Telephone Encounter (Signed)
Pt called about lab result. Will contact his PCP for pneumovax and we will get titers 6 weeks after. (refer to lab results on 07/20/17)

## 2017-10-04 ENCOUNTER — Ambulatory Visit: Payer: 59 | Admitting: Allergy and Immunology

## 2018-03-25 ENCOUNTER — Other Ambulatory Visit: Payer: Self-pay

## 2018-03-25 ENCOUNTER — Ambulatory Visit (INDEPENDENT_AMBULATORY_CARE_PROVIDER_SITE_OTHER): Payer: 59 | Admitting: Physician Assistant

## 2018-03-25 ENCOUNTER — Encounter: Payer: Self-pay | Admitting: Physician Assistant

## 2018-03-25 VITALS — BP 128/89 | HR 103 | Wt 189.0 lb

## 2018-03-25 DIAGNOSIS — N529 Male erectile dysfunction, unspecified: Secondary | ICD-10-CM

## 2018-03-25 DIAGNOSIS — Z131 Encounter for screening for diabetes mellitus: Secondary | ICD-10-CM

## 2018-03-25 DIAGNOSIS — F411 Generalized anxiety disorder: Secondary | ICD-10-CM

## 2018-03-25 DIAGNOSIS — Z5181 Encounter for therapeutic drug level monitoring: Secondary | ICD-10-CM

## 2018-03-25 DIAGNOSIS — R6882 Decreased libido: Secondary | ICD-10-CM | POA: Insufficient documentation

## 2018-03-25 DIAGNOSIS — Z125 Encounter for screening for malignant neoplasm of prostate: Secondary | ICD-10-CM

## 2018-03-25 DIAGNOSIS — Z13220 Encounter for screening for lipoid disorders: Secondary | ICD-10-CM

## 2018-03-25 DIAGNOSIS — Z130 Encounter for screening for diseases of the blood and blood-forming organs and certain disorders involving the immune mechanism: Secondary | ICD-10-CM

## 2018-03-25 MED ORDER — TADALAFIL 5 MG PO TABS: 5.0000 mg | ORAL_TABLET | Freq: Every day | ORAL | 0 refills | Status: DC | PRN

## 2018-03-25 MED ORDER — SILDENAFIL CITRATE 20 MG PO TABS: ORAL_TABLET | ORAL | 5 refills | Status: DC

## 2018-03-25 NOTE — Telephone Encounter (Signed)
Pt scheduled for appt today.

## 2018-03-25 NOTE — Progress Notes (Signed)
HPI:                                                                Jacob Mcmillan is a 53 y.o. male who presents to [facility] today for medication management  Erectile Dysfunction  This is a chronic problem. The current episode started more than 1 year ago. The problem is unchanged. Non-physiologic factors contributing to erectile dysfunction are anxiety. Irritative symptoms do not include frequency, nocturia or urgency. Obstructive symptoms do not include a slower stream, straining or a weak stream. Pertinent negatives include no dysuria, genital pain or hematuria. The symptoms are aggravated by alcohol use (drinks 7 beers/month). Past treatments include sildenafil and tadalafil. The treatment provided significant relief. He has been using treatment for 6 to 12 months. He has had no adverse reactions caused by medications. Risk factors include hypertension.   He alternates use of Cialis 5-10 mg and Viagra 40 mg prn. He never combines the two. Also requesting to have his "hormones checked." He endorses +decreased libido +lack of energy +decreased endurance/strength +decreased work performance +decreased enjoyment of life/feeling grumpy +ED  He was diagnosed with asthma by allergist, but states "Im not an asthmatic. I just had drainage." So he is not currently using inhalers.  Depression screen [facility] 2/9 03/25/2018 05/28/2016 12/07/2014  Decreased Interest 1 0 0  Down, Depressed, Hopeless 0 0 0  PHQ - 2 Score 1 0 0    GAD 7 : Generalized Anxiety Score 03/25/2018 06/12/2016 05/28/2016 04/08/2016  Nervous, Anxious, on Edge 1 1 2 2   Control/stop worrying 1 1 3 2   Worry too much - different things 1 1 2 2   Trouble relaxing 1 1 2 2   Restless 1 1 2 2   Easily annoyed or irritable 1 1 2 2   Afraid - awful might happen 0 0 1 2  Total GAD 7 Score 6 6 14 14   Anxiety Difficulty Somewhat difficult - - -      Past Medical History:  Diagnosis  Date  . Anxiety   . CHD (congenital heart disease)   . Clotting disorder (HCC)   . Heart murmur   . History of open heart surgery   . Hypertension   . Insomnia   . Mild persistent asthma 07/13/2017   Past Surgical History:  Procedure Laterality Date  . ARTERY REPAIR     as a child  . BACK SURGERY    . DENTAL RESTORATION/EXTRACTION WITH X-RAY    . SPINE SURGERY     Social History   Tobacco Use  . Smoking status: Never Smoker  . Smokeless tobacco: Never Used  Substance Use Topics  . Alcohol use: Yes    Alcohol/week: 1.0 standard drinks    Types: 1 Cans of beer per week   family history includes Alcohol abuse in his father; Depression in his mother and sister; Heart attack in his maternal uncle; Hyperlipidemia in his father and mother; Hypertension in his father; Lung cancer in his paternal aunt and paternal uncle; Melanoma in his sister.    ROS: Review of Systems  Constitutional: Positive for malaise/fatigue.  Genitourinary: Negative for dysuria, frequency, hematuria, nocturia and urgency.  Psychiatric/Behavioral: The patient is nervous/anxious.      Medications:  Current Outpatient Medications  Medication Sig Dispense Refill  . meloxicam (MOBIC) 15 MG tablet Take 15 mg by mouth daily as needed.    . sildenafil (REVATIO) 20 MG tablet Take 2 tablets by mouth as needed 1 hour prior to sexual activity. Max dose 100 mg/24 hours 50 tablet 5  . tadalafil (CIALIS) 5 MG tablet Take 1 tablet (5 mg total) by mouth daily as needed for erectile dysfunction. 30 tablet 0   No current facility-administered medications for this visit.    Allergies  Allergen Reactions  . Amoxicillin Swelling    Patient states amoxicillin causes swelling one time. Has had regular penicillin multiple times since then without reaction.  . Codeine     "throw up"       Objective:  BP 128/89   Pulse (!) 103   Wt 189 lb (85.7 kg)   BMI 29.16 kg/m  Gen:  alert, not ill-appearing, no distress,  appropriate for age HEENT: head normocephalic without obvious abnormality, conjunctiva and cornea clear, trachea midline Pulm: Normal work of breathing, normal phonation, clear to auscultation bilaterally, no wheezes, rales or rhonchi CV: mildly tachycardic rate, regular rhythm, s1 and s2 distinct, no murmurs, clicks or rubs  Neuro: alert and oriented x 3, no tremor MSK: extremities atraumatic, normal gait and station Skin: intact, no rashes on exposed skin, no jaundice, no cyanosis Psych: well-groomed, cooperative, good eye contact, euthymic mood, affect mood-congruent, speech is articulate, and thought processes clear and goal-directed  Lab Results  Component Value Date   CREATININE 1.15 07/01/2017   BUN 19 07/01/2017   NA 140 07/01/2017   K 4.5 07/01/2017   CL 103 07/01/2017   CO2 26 07/01/2017   Lab Results  Component Value Date   ALT 14 07/01/2017   AST 17 07/01/2017   ALKPHOS 46 05/31/2010   BILITOT 0.4 07/01/2017   Lab Results  Component Value Date   CHOL 187 06/15/2007   HDL 39 (L) 06/15/2007   LDLCALC 99 06/15/2007   TRIG 247 (H) 06/15/2007   CHOLHDL 4.8 Ratio 06/15/2007     Assessment and Plan: 53 y.o. male with   .Dimari was seen today for medication management.  Diagnoses and all orders for this visit:  Vasculogenic erectile dysfunction, unspecified vasculogenic erectile dysfunction type -     sildenafil (REVATIO) 20 MG tablet; Take 2 tablets by mouth as needed 1 hour prior to sexual activity. Max dose 100 mg/24 hours -     Testosterone  Low libido  Screening PSA (prostate specific antigen) -     PSA  Screening for blood disease -     CBC  Medication monitoring encounter -     PSA -     CBC -     COMPLETE METABOLIC PANEL WITH GFR  Screening for diabetes mellitus -     COMPLETE METABOLIC PANEL WITH GFR  Screening for lipid disorders -     Lipid Panel w/reflex Direct LDL  GAD (generalized anxiety disorder)    Low libido Positive ADAM  Questionnaire (8/10) Morning fasting testosterone level pending Baseline PSA and Hct ordered  ED Denies LUTS symptoms Patient counseled in detail on not combining Cialis and Viagra. May take one or the other. Provided with prescription for both due to monthly quantity limits with insurance. Counseled on max dose of each medication  Anxiety GAD7=6 Unchanged from 6 months ago Declines medication mgmt Active surveillance  Patient education and anticipatory guidance given Patient agrees with treatment plan Follow-up in  5 months for annual physical or sooner as needed if symptoms worsen or fail to improve  Levonne Hubertharley E. Cummings PA-C

## 2018-03-25 NOTE — Patient Instructions (Signed)
You can take EITHER Sildenafil (generic Viagra) or Tadalafil (generic Cialis), but you can't take both together and you can't take within 24 hours of each other Max dose of Cialis is 20 mg every 24 hours as needed for ED  Max dose of Viagra is 100 mg every 24 hours as needed for ED  Erectile Dysfunction Erectile dysfunction (ED) is the inability to get or keep an erection in order to have sexual intercourse. Erectile dysfunction may include:  Inability to get an erection.  Lack of enough hardness of the erection to allow penetration.  Loss of the erection before sex is finished. What are the causes? This condition may be caused by:  Certain medicines, such as: ? Pain relievers. ? Antihistamines. ? Antidepressants. ? Blood pressure medicines. ? Water pills (diuretics). ? Ulcer medicines. ? Muscle relaxants. ? Drugs.  Excessive drinking.  Psychological causes, such as: ? Anxiety. ? Depression. ? Sadness. ? Exhaustion. ? Performance fear. ? Stress.  Physical causes, such as: ? Artery problems. This may include diabetes, smoking, liver disease, or atherosclerosis. ? High blood pressure. ? Hormonal problems, such as low testosterone. ? Obesity. ? Nerve problems. This may include back or pelvic injuries, diabetes mellitus, multiple sclerosis, or Parkinson disease. What are the signs or symptoms? Symptoms of this condition include:  Inability to get an erection.  Lack of enough hardness of the erection to allow penetration.  Loss of the erection before sex is finished.  Normal erections at some times, but with frequent unsatisfactory episodes.  Low sexual satisfaction in either partner due to erection problems.  A curved penis occurring with erection. The curve may cause pain or the penis may be too curved to allow for intercourse.  Never having nighttime erections. How is this diagnosed? This condition is often diagnosed by:  Performing a physical exam to find  other diseases or specific problems with the penis.  Asking you detailed questions about the problem.  Performing blood tests to check for diabetes mellitus or to measure hormone levels.  Performing other tests to check for underlying health conditions.  Performing an ultrasound exam to check for scarring.  Performing a test to check blood flow to the penis.  Doing a sleep study at home to measure nighttime erections. How is this treated? This condition may be treated by:  Medicine taken by mouth to help you achieve an erection (oral medicine).  Hormone replacement therapy to replace low testosterone levels.  Medicine that is injected into the penis. Your health care provider may instruct you how to give yourself these injections at home.  Vacuum pump. This is a pump with a ring on it. The pump and ring are placed on the penis and used to create pressure that helps the penis become erect.  Penile implant surgery. In this procedure, you may receive: ? An inflatable implant. This consists of cylinders, a pump, and a reservoir. The cylinders can be inflated with a fluid that helps to create an erection, and they can be deflated after intercourse. ? A semi-rigid implant. This consists of two silicone rubber rods. The rods provide some rigidity. They are also flexible, so the penis can both curve downward in its normal position and become straight for sexual intercourse.  Blood vessel surgery, to improve blood flow to the penis. During this procedure, a blood vessel from a different part of the body is placed into the penis to allow blood to flow around (bypass) damaged or blocked blood vessels.  Lifestyle  changes, such as exercising more, losing weight, and quitting smoking. Follow these instructions at home: Medicines   Take over-the-counter and prescription medicines only as told by your health care provider. Do not increase the dosage without first discussing it with your health  care provider.  If you are using self-injections, perform injections as directed by your health care provider. Make sure to avoid any veins that are on the surface of the penis. After giving an injection, apply pressure to the injection site for 5 minutes. General instructions  Exercise regularly, as directed by your health care provider. Work with your health care provider to lose weight, if needed.  Do not use any products that contain nicotine or tobacco, such as cigarettes and e-cigarettes. If you need help quitting, ask your health care provider.  Before using a vacuum pump, read the instructions that come with the pump and discuss any questions with your health care provider.  Keep all follow-up visits as told by your health care provider. This is important. Contact a health care provider if:  You feel nauseous.  You vomit. Get help right away if:  You are taking oral or injectable medicines and you have an erection that lasts longer than 4 hours. If your health care provider is unavailable, go to the nearest emergency room for evaluation. An erection that lasts much longer than 4 hours can result in permanent damage to your penis.  You have severe pain in your groin or abdomen.  You develop redness or severe swelling of your penis.  You have redness spreading up into your groin or lower abdomen.  You are unable to urinate.  You experience chest pain or a rapid heart beat (palpitations) after taking oral medicines. Summary  Erectile dysfunction (ED) is the inability to get or keep an erection during sexual intercourse. This problem can usually be treated successfully.  This condition is diagnosed based on a physical exam, your symptoms, and tests to determine the cause. Treatment varies depending on the cause, and may include medicines, hormone therapy, surgery, or vacuum pump.  You may need follow-up visits to make sure that you are using your medicines or devices  correctly.  Get help right away if you are taking or injecting medicines and you have an erection that lasts longer than 4 hours. This information is not intended to replace advice given to you by your health care provider. Make sure you discuss any questions you have with your health care provider. Document Released: 02/07/2000 Document Revised: 02/26/2016 Document Reviewed: 02/26/2016 Elsevier Interactive Patient Education  2019 [employer].

## 2018-03-25 NOTE — Telephone Encounter (Signed)
I sent a single refill. He will need follow-up for future refills

## 2018-03-25 NOTE — Telephone Encounter (Signed)
Jacob Mcmillan requests a refill on Cialis to send to [facility]. He is past due for a follow up. He states he has started a new job and is unable to come in. Please advise.

## 2018-04-04 ENCOUNTER — Encounter: Payer: Self-pay | Admitting: Physician Assistant

## 2018-05-10 ENCOUNTER — Telehealth: Payer: Self-pay | Admitting: Physician Assistant

## 2018-05-10 DIAGNOSIS — N529 Male erectile dysfunction, unspecified: Secondary | ICD-10-CM

## 2018-05-10 MED ORDER — TADALAFIL 5 MG PO TABS: 5.0000 mg | ORAL_TABLET | Freq: Every day | ORAL | 1 refills | Status: DC | PRN

## 2018-05-10 NOTE — Telephone Encounter (Signed)
Patient would like a refill on the tadalafil (CIALIS) 5 MG tablet [[id number]] called in to the [facility] in [place], [address], [place], [place] [address]. Please advise.

## 2018-05-10 NOTE — Telephone Encounter (Signed)
Rx sent 

## 2018-06-02 ENCOUNTER — Ambulatory Visit: Payer: 59 | Admitting: Podiatry

## 2018-06-09 ENCOUNTER — Ambulatory Visit: Payer: 59 | Admitting: Podiatry

## 2018-06-24 ENCOUNTER — Ambulatory Visit: Payer: 59 | Admitting: Podiatry

## 2018-06-27 ENCOUNTER — Ambulatory Visit: Payer: 59 | Admitting: Podiatry

## 2018-07-15 ENCOUNTER — Ambulatory Visit: Payer: 59 | Admitting: Podiatry

## 2018-08-01 ENCOUNTER — Other Ambulatory Visit: Payer: Self-pay | Admitting: Physician Assistant

## 2018-08-01 DIAGNOSIS — N529 Male erectile dysfunction, unspecified: Secondary | ICD-10-CM

## 2018-09-12 ENCOUNTER — Other Ambulatory Visit: Payer: Self-pay | Admitting: Physician Assistant

## 2018-09-12 DIAGNOSIS — N529 Male erectile dysfunction, unspecified: Secondary | ICD-10-CM

## 2018-10-15 ENCOUNTER — Other Ambulatory Visit: Payer: Self-pay | Admitting: Physician Assistant

## 2018-10-15 DIAGNOSIS — N529 Male erectile dysfunction, unspecified: Secondary | ICD-10-CM

## 2018-11-18 ENCOUNTER — Telehealth: Payer: Self-pay | Admitting: Family Medicine

## 2018-11-18 ENCOUNTER — Other Ambulatory Visit: Payer: Self-pay

## 2018-11-18 DIAGNOSIS — N529 Male erectile dysfunction, unspecified: Secondary | ICD-10-CM

## 2018-11-18 MED ORDER — TADALAFIL 5 MG PO TABS: 5.0000 mg | ORAL_TABLET | Freq: Every day | ORAL | 0 refills | Status: DC

## 2018-11-18 NOTE — Telephone Encounter (Signed)
Refill sent to pharmacy -[facility]  

## 2018-11-18 NOTE — Telephone Encounter (Signed)
PT came into the office wanting a refill on his medication. I set up a follow up with Dr. Sheppard Coil on October 20th.   Please Advise.  CIALIS 5MG 

## 2018-12-13 ENCOUNTER — Encounter: Payer: Self-pay | Admitting: Osteopathic Medicine

## 2018-12-13 ENCOUNTER — Other Ambulatory Visit: Payer: Self-pay

## 2018-12-13 ENCOUNTER — Ambulatory Visit (INDEPENDENT_AMBULATORY_CARE_PROVIDER_SITE_OTHER): Payer: 59 | Admitting: Osteopathic Medicine

## 2018-12-13 VITALS — BP 148/94 | HR 105 | Temp 97.8°F | Wt 183.0 lb

## 2018-12-13 DIAGNOSIS — N529 Male erectile dysfunction, unspecified: Secondary | ICD-10-CM | POA: Diagnosis not present

## 2018-12-13 DIAGNOSIS — Z0000 Encounter for general adult medical examination without abnormal findings: Secondary | ICD-10-CM | POA: Diagnosis not present

## 2018-12-13 DIAGNOSIS — R030 Elevated blood-pressure reading, without diagnosis of hypertension: Secondary | ICD-10-CM | POA: Diagnosis not present

## 2018-12-13 DIAGNOSIS — R000 Tachycardia, unspecified: Secondary | ICD-10-CM | POA: Diagnosis not present

## 2018-12-13 MED ORDER — TADALAFIL 10 MG PO TABS: 10.0000 mg | ORAL_TABLET | Freq: Every day | ORAL | 1 refills | Status: DC

## 2018-12-13 NOTE — Patient Instructions (Addendum)
Plan:  Can increase Cialis to 10 mg daily, though this is considered "off label" use (typical daily dose is 2.5-5 mg). I would not go higher than this dose.   If this isn't helping, can refer to Urology to discuss other therapies for ED that might also help with urinary problems.   Increased blood pressure is also concerning. I'd like to get blood work done and have you check your pressure at home, bring your BP machine into the office in a month or so for your "annual" when we can also go over blood work results.

## 2018-12-13 NOTE — Progress Notes (Signed)
HPI: Jacob Mcmillan is a 53 y.o. male who  has a past medical history of Anxiety, CHD (congenital heart disease), Clotting disorder ([place]), Heart murmur, History of open heart surgery, Hypertension, Insomnia, and Mild persistent asthma (07/13/2017).  he presents to [facility] today, 12/13/18,  for chief complaint of: Medication refills  Requesting refills on Cialis, wants to know if we can increase the dose. Reluctant to be on BP medications. We discussed need for getting labs done. He's not too worried about low testosterone.   BP Readings from Last 3 Encounters:  12/13/18 (!) 148/94  03/25/18 128/89  07/13/17 128/88       Past medical, surgical, social and family history reviewed:  Patient Active Problem List   Diagnosis Date Noted  . Low libido 03/25/2018  . Mild persistent asthma 07/13/2017  . Nonallergic rhinitis 07/13/2017  . Cough, persistent 07/13/2017  . Fever of unknown origin (FUO) 06/24/2017  . Chronic night sweats 06/24/2017  . Environmental allergies 06/24/2017  . Arthritis of left acromioclavicular joint 03/09/2017  . Swelling of left hand 01/08/2017  . Insomnia due to other mental disorder 06/12/2016  . White coat syndrome without diagnosis of hypertension 05/28/2016  . Attention deficit hyperactivity disorder (ADHD), combined type 04/08/2016  . GAD (generalized anxiety disorder) 04/08/2016  . 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 Elevated blood pressure reading 04/08/2016  . Pain of left sternoclavicular joint 02/18/2016  . Impingement syndrome of left shoulder 02/18/2016    Past Surgical History:  Procedure Laterality Date  . ARTERY REPAIR     as a child  . BACK SURGERY    . DENTAL RESTORATION/EXTRACTION WITH X-RAY    . SPINE SURGERY      Social History   Tobacco Use  . Smoking status: Never Smoker  . Smokeless tobacco: Never Used  Substance Use Topics  . Alcohol use: Yes    Alcohol/week: 1.0  standard drinks    Types: 1 Cans of beer per week    Family History  Problem Relation Age of Onset  . Depression Mother   . Hyperlipidemia Mother   . Hypertension Father   . Hyperlipidemia Father   . Alcohol abuse Father   . Depression Sister   . Melanoma Sister   . Lung cancer Paternal Aunt   . Lung cancer Paternal Uncle   . Heart attack Maternal Uncle      Current medication list and allergy/intolerance information reviewed:    Current Outpatient Medications  Medication Sig Dispense Refill  . tadalafil (CIALIS) 5 MG tablet Take 1 tablet (5 mg total) by mouth daily. Must keep follow up appointment for more refills 30 tablet 0  . meloxicam (MOBIC) 15 MG tablet Take 15 mg by mouth daily as needed.    . sildenafil (REVATIO) 20 MG tablet Take 2 tablets by mouth as needed 1 hour prior to sexual activity. Max dose 100 mg/24 hours (Patient not taking: Reported on 12/13/2018) 50 tablet 5   No current facility-administered medications for this visit.     Allergies  Allergen Reactions  . Amoxicillin Swelling    Patient states amoxicillin causes swelling one time. Has had regular penicillin multiple times since then without reaction.  . Codeine     "throw up"      Review of Systems:  Constitutional:  No  fever, no chills, No recent illness  HEENT: No  headache, no vision change  Cardiac: No  chest pain, No  pressure  Respiratory:  No  shortness of breath. No  Cough  Musculoskeletal: No new myalgia/arthralgia  Endocrine: No cold intolerance,  No heat intolerance. No polyuria/polydipsia/polyphagia   Psychiatric: No  concerns with depression, No  concerns with anxiety, No sleep problems, No mood problems  Exam:  BP (!) 148/94 (BP Location: Left Arm, Patient Position: Sitting, Cuff Size: Normal)   Pulse (!) 105   Temp 97.8 F (36.6 C) (Oral)   Wt 183 lb 0.6 oz (83 kg)   BMI 28.24 kg/m   Constitutional: VS see above. General Appearance: alert, well-developed,  well-nourished, NAD  Eyes: Normal lids and conjunctive, non-icteric sclera  Neck: No masses, trachea midline. No thyroid enlargement. No tenderness/mass appreciated. No lymphadenopathy  Respiratory: Normal respiratory effort. no wheeze, no rhonchi, no rales  Cardiovascular: S1/S2 normal, no murmur, no rub/gallop auscultated. RRR. No lower extremity edema.   Musculoskeletal: Gait normal. No clubbing/cyanosis of digits.   Neurological: Normal balance/coordination. No tremor.   Skin: warm, dry, intact. No rash/ulcer.  Psychiatric: Normal judgment/insight. Normal mood and affect. Oriented x3.    No results found for this or any previous visit (from the past 72 hour(s)).  No results found.       ASSESSMENT/PLAN: The primary encounter diagnosis was Elevated blood pressure reading. Diagnoses of Organic erectile dysfunction, Tachycardia with heart rate 100-120 beats per minute, and Annual physical exam were also pertinent to this visit.   Orders Placed This Encounter  Procedures  . CBC  . COMPLETE METABOLIC PANEL WITH GFR  . Lipid panel  . TSH  . Testosterone    Meds ordered this encounter  Medications  . tadalafil (CIALIS) 10 MG tablet    Sig: Take 1 tablet (10 mg total) by mouth daily.    Dispense:  30 tablet    Refill:  1    Patient Instructions  Plan:  Can increase Cialis to 10 mg daily, though this is considered "off label" use (typical daily dose is 2.5-5 mg). I would not go higher than this dose.   If this isn't helping, can refer to Urology to discuss other therapies for ED that might also help with urinary problems.   Increased blood pressure is also concerning. I'd like to get blood work done and have you check your pressure at home, bring your BP machine into the office in a month or so for your "annual" when we can also go over blood work results.          Visit summary with medication list and pertinent instructions was printed for patient to  review. All questions at time of visit were answered - patient instructed to contact office with any additional concerns or updates. ER/RTC precautions were reviewed with the patient.   Note: Total time spent 25 minutes, greater than 50% of the visit was spent face-to-face counseling and coordinating care for the above diagnoses listed in assessment/plan.   Please note: voice recognition software was used to produce this document, and typos may escape review. Please contact Dr. Lyn Hollingshead for any needed clarifications.     Follow-up plan: Return in about 4 weeks (around 01/10/2019) for recheck blood pressure, go over lab results. Bring home BP machine with you to visit .

## 2018-12-21 ENCOUNTER — Other Ambulatory Visit: Payer: Self-pay

## 2018-12-21 ENCOUNTER — Telehealth: Payer: Self-pay | Admitting: Osteopathic Medicine

## 2018-12-21 MED ORDER — TADALAFIL 10 MG PO TABS: 10.0000 mg | ORAL_TABLET | Freq: Every day | ORAL | 1 refills | Status: DC

## 2018-12-21 NOTE — Telephone Encounter (Signed)
Pt called. Script for Cialis was sent to the wrong pharmacy it  needs to be sent to [facility] store #[id number] [address] in [place].  Thank you.

## 2018-12-21 NOTE — Telephone Encounter (Signed)
Pt aware.     Thank you

## 2018-12-21 NOTE — Telephone Encounter (Signed)
Task completed. [place] and cancelled the previous rx. Rx has been re-sent to preferred pharmacy - [facility]. Thanks.

## 2019-01-17 ENCOUNTER — Ambulatory Visit (INDEPENDENT_AMBULATORY_CARE_PROVIDER_SITE_OTHER): Payer: 59 | Admitting: Osteopathic Medicine

## 2019-01-17 ENCOUNTER — Encounter: Payer: Self-pay | Admitting: Osteopathic Medicine

## 2019-01-17 ENCOUNTER — Other Ambulatory Visit: Payer: Self-pay

## 2019-01-17 VITALS — BP 132/91 | HR 82 | Temp 98.0°F | Wt 185.0 lb

## 2019-01-17 DIAGNOSIS — I10 Essential (primary) hypertension: Secondary | ICD-10-CM

## 2019-01-17 DIAGNOSIS — F419 Anxiety disorder, unspecified: Secondary | ICD-10-CM

## 2019-01-17 DIAGNOSIS — N529 Male erectile dysfunction, unspecified: Secondary | ICD-10-CM

## 2019-01-17 MED ORDER — ALPRAZOLAM 0.5 MG PO TABS: 0.2500 mg | ORAL_TABLET | Freq: Two times a day (BID) | ORAL | 0 refills | Status: DC | PRN

## 2019-01-17 MED ORDER — TADALAFIL 10 MG PO TABS: 10.0000 mg | ORAL_TABLET | Freq: Every day | ORAL | 0 refills | Status: DC

## 2019-01-17 NOTE — Progress Notes (Signed)
HPI: Jacob Mcmillan is a 53 y.o. male who  has a past medical history of Anxiety, CHD (congenital heart disease), Clotting disorder ([place]), Heart murmur, History of open heart surgery, Hypertension, Insomnia, and Mild persistent asthma (07/13/2017).  he presents to [facility] today, 01/17/19,  for chief complaint of:  BP follow-up   BP again above goal today Pt reports he thinks anxiety/srtress is the main reason for this No CP/SOB He's resistant to take medications for BP Would like to try being more diligent w/ lifestyle modifications     At today's visit 01/17/19 ... PMH, PSH, FH reviewed and updated as needed.  Current medication list and allergy/intolerance hx reviewed and updated as needed. (See remainder of HPI, ROS, Phys Exam below)   No results found.  No results found for this or any previous visit (from the past 72 hour(s)).  BP Readings from Last 3 Encounters:  01/17/19 (!) 132/91  12/13/18 (!) 148/94  03/25/18 128/89          ASSESSMENT/PLAN: The primary encounter diagnosis was Essential hypertension. Diagnoses of Anxiety and Erectile dysfunction, unspecified erectile dysfunction type were also pertinent to this visit.   I think Rx for HTN would be reasonable. Pt educated on risks/benefits of Rx, natural history of untreated HTN and consequences/complications.   Advised off-label Cialis at 10 mg daily, I won't be surprised if insurance will not cover this  Sparing use alprazolam for anxiety, pt states he feels better having something just in case. Hopefully this helps BP  No orders of the defined types were placed in this encounter.    Meds ordered this encounter  Medications  . ALPRAZolam (XANAX) 0.5 MG tablet    Sig: Take 0.5-1 tablets (0.25-0.5 mg total) by mouth 2 (two) times daily as needed for anxiety.    Dispense:  15 tablet    Refill:  0  . tadalafil (CIALIS) 10 MG tablet    Sig: Take 1 tablet (10 mg  total) by mouth daily.    Dispense:  90 tablet    Refill:  0    Patient Instructions  Keep track of your BP at home. Goal 130/80 or at least get to 140/90 or less.  Anxiety/nerves: I sent Rx for Xanax to take SPARINGLY only as needed for SEVERE SYMPTOMS or panic. 15 ills to last 90 days / appointment sooner if needed to discuss daily medications.        Follow-up plan: Return in about 3 months (around 04/19/2019) for follow up on BP and nerves / see me sooner if needed .                                                 ################################################# ################################################# ################################################# #################################################    Current Meds  Medication Sig  . tadalafil (CIALIS) 10 MG tablet Take 1 tablet (10 mg total) by mouth daily.  . [DISCONTINUED] tadalafil (CIALIS) 10 MG tablet Take 1 tablet (10 mg total) by mouth daily.    Allergies  Allergen Reactions  . Amoxicillin Swelling    Patient states amoxicillin causes swelling one time. Has had regular penicillin multiple times since then without reaction.  . Codeine     "throw up"       Review of Systems:  Constitutional: No recent illness  HEENT: No  headache, no  vision change  Cardiac: No  chest pain, No  pressure, No palpitations  Respiratory:  No  shortness of breath. No  Cough  Gastrointestinal: No  abdominal pain, no change on bowel habits  Musculoskeletal: No new myalgia/arthralgia  Psychiatric: No  concerns with depression, +concerns with anxiety  Exam:  BP (!) 132/91 (BP Location: Right Arm, Patient Position: Sitting, Cuff Size: Normal)   Pulse 82   Temp 98 F (36.7 C) (Oral)   Wt 185 lb 0.6 oz (83.9 kg)   BMI 28.55 kg/m   Constitutional: VS see above. General Appearance: alert, well-developed, well-nourished, NAD  Eyes: Normal lids and conjunctive,  non-icteric sclera  Neck: No masses, trachea midline.   Respiratory: Normal respiratory effort. no wheeze, no rhonchi, no rales  Cardiovascular: S1/S2 normal, no murmur, no rub/gallop auscultated. RRR.   Musculoskeletal: Gait normal. Symmetric and independent movement of all extremities  Neurological: Normal balance/coordination. No tremor.  Skin: warm, dry, intact.   Psychiatric: Normal judgment/insight. Normal mood and affect. Oriented x3.       Visit summary with medication list and pertinent instructions was printed for patient to review, patient was advised to alert [place] if any updates are needed. All questions at time of visit were answered - patient instructed to contact office with any additional concerns. ER/RTC precautions were reviewed with the patient and understanding verbalized.   Note: Total time spent 25 minutes, greater than 50% of the visit was spent face-to-face counseling and coordinating care for the following: The primary encounter diagnosis was Essential hypertension. Diagnoses of Anxiety and Erectile dysfunction, unspecified erectile dysfunction type were also pertinent to this visit.Marland Kitchen  Please note: voice recognition software was used to produce this document, and typos may escape review. Please contact Dr. Lyn Hollingshead for any needed clarifications.    Follow up plan: Return in about 3 months (around 04/19/2019) for follow up on BP and nerves / see me sooner if needed .

## 2019-01-17 NOTE — Patient Instructions (Addendum)
Keep track of your BP at home. Goal 130/80 or at least get to 140/90 or less.  Anxiety/nerves: I sent Rx for Xanax to take SPARINGLY only as needed for SEVERE SYMPTOMS or panic. 15 ills to last 90 days / appointment sooner if needed to discuss daily medications.

## 2019-03-03 ENCOUNTER — Encounter: Payer: Self-pay | Admitting: Nurse Practitioner

## 2019-03-03 ENCOUNTER — Ambulatory Visit (INDEPENDENT_AMBULATORY_CARE_PROVIDER_SITE_OTHER): Payer: 59 | Admitting: Nurse Practitioner

## 2019-03-03 ENCOUNTER — Other Ambulatory Visit: Payer: Self-pay

## 2019-03-03 VITALS — BP 148/86 | HR 84 | Ht 68.0 in | Wt 187.0 lb

## 2019-03-03 DIAGNOSIS — F411 Generalized anxiety disorder: Secondary | ICD-10-CM

## 2019-03-03 DIAGNOSIS — R030 Elevated blood-pressure reading, without diagnosis of hypertension: Secondary | ICD-10-CM

## 2019-03-03 DIAGNOSIS — F99 Mental disorder, not otherwise specified: Secondary | ICD-10-CM | POA: Diagnosis not present

## 2019-03-03 DIAGNOSIS — F5105 Insomnia due to other mental disorder: Secondary | ICD-10-CM

## 2019-03-03 MED ORDER — HYDROXYZINE HCL 25 MG PO TABS: 25.0000 mg | ORAL_TABLET | Freq: Every evening | ORAL | 0 refills | Status: AC | PRN

## 2019-03-03 NOTE — Patient Instructions (Addendum)
Al-Anon is a good option for the family of people with addictions. General counseling would also be a good option to help with your anxiety.   Supporting Someone With an Addiction Addiction is a condition that causes an uncontrollable (compulsive) need for a substance or behavior. A person can be addicted to alcohol, illegal drugs, or prescription medicines, such as painkillers. An addiction can also be to a behavior, like gambling or shopping. Addiction can change the way a person's brain works. The need for the drug or activity can become so strong that the person thinks about it all the time or becomes physically dependent on it. When a person has an addiction, his or her condition can affect others, such as friends and family members. Friends and family can help by offering support and understanding. What do I need to know about addiction? Addiction can cause problems with mental and physical health. It can affect a person's ability to have healthy relationships and to meet responsibilities at home and at work or school. Signs of addiction may include:  An intense craving for a drug or activity.  Always thinking about the addiction.  Planning life activities around the addiction.  Being unable to stop using a drug or participating in a behavior.  Devoting more time to the addiction than to other responsibilities, like school, work, or family.  An increasing need for money. An addiction might lead a person to ask for unusual loans or steal items to sell.  An exaggerated response to difficult situations, such as: ? Extreme anxiety or irritability. ? Aggression. ? Lying.  Having trouble being realistic about the negative effects of a drug or activity. What do I need to know about the treatment options? Treatment for addiction and recovery can be a long process. Your loved one's treatment may involve:  Stopping substance use safely. This may require taking medicines and being closely  observed for several days.  Group or individual counseling from mental health providers. Your loved one may attend daily counseling sessions at a treatment center.  Medicines to treat the addiction.  Going to a support group. These groups are an important part of long-term recovery for many people. They include 12-step programs like Alcoholics Anonymous (AA), Narcotics Anonymous (NA), or Gamblers Anonymous (GA). Many people who undergo treatment start the addiction again after stopping. This is called a relapse. If your loved one has a relapse, that does not mean that treatment will not work. Keep in mind that:  This disorder involves the brain, the body, and the people in a person's life (social system). Changing unhealthy behaviors is a complex process that requires determination from your loved one.  Your loved one may need to try several times to recover.  Your support is important in helping your loved one to recover. A responsible adult may need to stay with your loved one for some time after treatment. This person can help your loved one stay on track with recovery and can watch for symptoms that are getting worse. How can I support my loved one? Talk about the addiction  Be careful about too much prodding. Try not to overdo reminders to an adult friend or family member about things like taking medicines. Ask how your loved one prefers that you help.  Explain that it is not easy to quit because addiction can change the part of the brain that gives someone self-control. Also, some people can easily become addicted because of their family genes.  Never ignore comments  about suicide, and do not try to avoid the subject of suicide. Talking about suicide will not make your loved one want to act on it. You or your loved one can reach out 24 hours a day to get free, private support (on the phone or a live online chat) from a suicide crisis helpline, such as the [place] at 607 249 8820. Ask your loved one if you can go with him or her to meet with his or her health care provider or therapist.  Ask if your loved one is open to giving you written permission to communicate with his or her providers if needed. Find support and resources  Work with a health care provider who specializes in addiction.  Refer your loved one to trusted online resources that can provide information about addiction. A health care provider may be able to recommend resources. You could start with: ? Government sites such as the Substance Abuse and Information systems manager [place]): ktimeonline.com ? National mental health organizations such as the [employer] [address] ([place]): www.nami.org  Look into local support groups or 12-step programs for your loved one.  Connect with people in peer and family support groups. People in these groups understand what you and your loved one are going through. They can help you feel a sense of comfort and connect you with local resources to help you learn more. General support  Make an effort to learn all you can about substance use disorder.  Help your loved one follow his or her treatment plan as directed by health care providers. This could mean driving him or her to therapy sessions or support group meetings.  Tell your loved one that you will keep giving support as long as he or she follows the treatment plan.  Assure your loved one that even though treatment may be hard, it often works. Substance use disorder can be managed.  Encourage your loved one to avoid things, people, and situations (triggers) that may cause a relapse.  Talk with your loved one's treatment center staff or health care provider about how you can keep supporting your loved one during treatment, recovery, and relapses if necessary. Follow these instructions at home: Safety  Talk with your loved one's health care provider about ways he  or she can stay safe. This may include: ? Vaccinations. ? Medicines to prevent death from overdose. ? Referrals for a clean needle exchange program. ? Sexual health counseling.  If you believe that your loved one is driving while using drugs or alcohol, it is important to confront him or her about the dangers of driving while drunk or high. In some cases, you may need to call the police to prevent harm to your loved one or others. Lifestyle  Find ways to care for your body, mind, and well-being while supporting someone with an addiction, such as: ? Eat a healthy diet, exercise regularly, and get plenty of sleep. ? Join a support group for family members of people with addiction. ? Seek individual therapy to help you learn to cope with your loved one's disorder. ? Try to maintain your normal routines. ? Make time for activities that help you relax, and try to not feel guilty about taking time for yourself. What are some signs that the addiction is getting worse?  Continuing to use more and more of a drug or do more and more of an activity over time.  Continuing the drug or activity even after using  it has had negative consequences such as health problems or job loss.  Having physical symptoms when he or she tries to quit (withdrawal). Symptoms depend on the drug or substance, but general symptoms may include: ? Nausea or vomiting. ? Restlessness and irritability. ? Sweating.  A feeling in you that you are powerless to help your loved one get better. Get help right away if:  Your loved one is showing signs that he or she is thinking about hurting himself, herself, or someone else. If you ever feel like your loved one may hurt himself or herself or others, or may have thoughts about taking his or her own life, get help right away. You can go to your nearest emergency department or call:  Your local emergency services (911 in the [place]  A suicide crisis helpline, such as the National  Suicide Prevention Lifeline at 437-146-09971-703-475-6633. This is open 24 hours a day. Summary  Addiction is a condition that causes an uncontrollable (compulsive) need for a substance or behavior.  Treatment for addiction may include group or individual counseling and support groups.  Many people who undergo treatment start the addiction again after stopping. If your loved one has a relapse, that does not mean that treatment will not work.  Find ways to care for your body, mind, and well-being while supporting someone with an addiction.  If you ever feel like your loved one may hurt himself or herself or others, or may have thoughts about taking his or her own life, get help right away. This information is not intended to replace advice given to you by your health care provider. Make sure you discuss any questions you have with your health care provider. Document Revised: 11/04/2018 Document Reviewed: 03/24/2017 Elsevier Patient Education  2020 [employer] Inc.   Generalized Anxiety Disorder, Adult Generalized anxiety disorder (GAD) is a mental health disorder. People with this condition constantly worry about everyday events. Unlike normal anxiety, worry related to GAD is not triggered by a specific event. These worries also do not fade or get better with time. GAD interferes with life functions, including relationships, work, and school. GAD can vary from mild to severe. People with severe GAD can have intense waves of anxiety with physical symptoms (panic attacks). What are the causes? The exact cause of GAD is not known. What increases the risk? This condition is more likely to develop in:  Women.  People who have a family history of anxiety disorders.  People who are very shy.  People who experience very stressful life events, such as the death of a loved one.  People who have a very stressful family environment. What are the signs or symptoms? People with GAD often worry excessively  about many things in their lives, such as their health and family. They may also be overly concerned about:  Doing well at work.  Being on time.  Natural disasters.  Friendships. Physical symptoms of GAD include:  Fatigue.  Muscle tension or having muscle twitches.  Trembling or feeling shaky.  Being easily startled.  Feeling like your heart is pounding or racing.  Feeling out of breath or like you cannot take a deep breath.  Having trouble falling asleep or staying asleep.  Sweating.  Nausea, diarrhea, or irritable bowel syndrome (IBS).  Headaches.  Trouble concentrating or remembering facts.  Restlessness.  Irritability. How is this diagnosed? Your health care provider can diagnose GAD based on your symptoms and medical history. You will also have a physical exam.  The health care provider will ask specific questions about your symptoms, including how severe they are, when they started, and if they come and go. Your health care provider may ask you about your use of alcohol or drugs, including prescription medicines. Your health care provider may refer you to a mental health specialist for further evaluation. Your health care provider will do a thorough examination and may perform additional tests to rule out other possible causes of your symptoms. To be diagnosed with GAD, a person must have anxiety that:  Is out of his or her control.  Affects several different aspects of his or her life, such as work and relationships.  Causes distress that makes him or her unable to take part in normal activities.  Includes at least three physical symptoms of GAD, such as restlessness, fatigue, trouble concentrating, irritability, muscle tension, or sleep problems. Before your health care provider can confirm a diagnosis of GAD, these symptoms must be present more days than they are not, and they must last for six months or longer. How is this treated? The following therapies  are usually used to treat GAD:  Medicine. Antidepressant medicine is usually prescribed for long-term daily control. Antianxiety medicines may be added in severe cases, especially when panic attacks occur.  Talk therapy (psychotherapy). Certain types of talk therapy can be helpful in treating GAD by providing support, education, and guidance. Options include: ? Cognitive behavioral therapy (CBT). People learn coping skills and techniques to ease their anxiety. They learn to identify unrealistic or negative thoughts and behaviors and to replace them with positive ones. ? Acceptance and commitment therapy (ACT). This treatment teaches people how to be mindful as a way to cope with unwanted thoughts and feelings. ? Biofeedback. This process trains you to manage your body's response (physiological response) through breathing techniques and relaxation methods. You will work with a therapist while machines are used to monitor your physical symptoms.  Stress management techniques. These include yoga, meditation, and exercise. A mental health specialist can help determine which treatment is best for you. Some people see improvement with one type of therapy. However, other people require a combination of therapies. Follow these instructions at home:  Take over-the-counter and prescription medicines only as told by your health care provider.  Try to maintain a normal routine.  Try to anticipate stressful situations and allow extra time to manage them.  Practice any stress management or self-calming techniques as taught by your health care provider.  Do not punish yourself for setbacks or for not making progress.  Try to recognize your accomplishments, even if they are small.  Keep all follow-up visits as told by your health care provider. This is important. Contact a health care provider if:  Your symptoms do not get better.  Your symptoms get worse.  You have signs of depression, such as: ? A  persistently sad, cranky, or irritable mood. ? Loss of enjoyment in activities that used to bring you joy. ? Change in weight or eating. ? Changes in sleeping habits. ? Avoiding friends or family members. ? Loss of energy for normal tasks. ? Feelings of guilt or worthlessness. Get help right away if:  You have serious thoughts about hurting yourself or others. If you ever feel like you may hurt yourself or others, or have thoughts about taking your own life, get help right away. You can go to your nearest emergency department or call:  Your local emergency services (911 in the [place]  A  suicide crisis helpline, such as the National Suicide Prevention Lifeline at 367-776-2033. This is open 24 hours a day. Summary  Generalized anxiety disorder (GAD) is a mental health disorder that involves worry that is not triggered by a specific event.  People with GAD often worry excessively about many things in their lives, such as their health and family.  GAD may cause physical symptoms such as restlessness, trouble concentrating, sleep problems, frequent sweating, nausea, diarrhea, headaches, and trembling or muscle twitching.  A mental health specialist can help determine which treatment is best for you. Some people see improvement with one type of therapy. However, other people require a combination of therapies. This information is not intended to replace advice given to you by your health care provider. Make sure you discuss any questions you have with your health care provider. Document Revised: 01/22/2017 Document Reviewed: 12/31/2015 Elsevier Patient Education  2020 [employer].

## 2019-03-03 NOTE — Progress Notes (Addendum)
Acute Office Visit  Subjective:    Patient ID: Jacob Mcmillan, male    DOB: 1965/03/31, 54 y.o.   MRN: [id number]  CC: Anxiety and not sleeping  HPI Patient is in today for anxiety and insomnia. He reports multiple stressors in his life including the death of a nephew from heroin overdose and trying to help his daughter who is addicted to heroin. He reports his daughters life choices have had a significant negatively impact on his mental health. This has also had a negative impact on his marriage. He has made multiple attempts to help his daughter, but has been unsuccessful in helping her achieve sobriety. He reports that when he slows down at night and lets his mind rest he begins to obsess over the situation with his daughter and worries about her and he becomes anxious and is unable to sleep. This is now affecting him during the day as he is tired and irritable. He reports feeling trapped. He states that the only time he rests well is when he takes xanax to sleep. He is requesting a refill on this medication today.   BP is elevated today at 148/86, which the patient reports is common when he is anxious.   ANXIETY/STRESS Duration:uncontrolled Anxious mood: yes  Excessive worrying: yes Irritability: yes  Sweating: no Nausea: no Palpitations:no Hyperventilation: no Panic attacks: yes Agoraphobia: no  Obscessions/compulsions: yes Depressed mood: no Depression screen [facility] 2/9 03/25/2018 05/28/2016 12/07/2014  Decreased Interest 1 0 0  Down, Depressed, Hopeless 0 0 0  PHQ - 2 Score 1 0 0   Anhedonia: no Weight changes: no Insomnia: yes hard to fall asleep  Hypersomnia: no Fatigue/loss of energy: no Feelings of worthlessness: no Feelings of guilt: no Impaired concentration/indecisiveness: yes Suicidal ideations: no  Crying spells: no Recent Stressors/Life Changes: yes   Relationship problems: yes   Family stress: yes     Financial stress: no    Job stress: no    Recent  death/loss: yes   Past Medical History:  Diagnosis Date  . Anxiety   . CHD (congenital heart disease)   . Clotting disorder (HCC)   . Heart murmur   . History of open heart surgery   . Hypertension   . Insomnia   . Mild persistent asthma 07/13/2017    Past Surgical History:  Procedure Laterality Date  . ARTERY REPAIR     as a child  . BACK SURGERY    . DENTAL RESTORATION/EXTRACTION WITH X-RAY    . SPINE SURGERY      Family History  Problem Relation Age of Onset  . Depression Mother   . Hyperlipidemia Mother   . Hypertension Father   . Hyperlipidemia Father   . Alcohol abuse Father   . Depression Sister   . Melanoma Sister   . Lung cancer Paternal Aunt   . Lung cancer Paternal Uncle   . Heart attack Maternal Uncle     Social History   Socioeconomic History  . Marital status: Single    Spouse name: Not on file  . Number of children: Not on file  . Years of education: Not on file  . Highest education level: Not on file  Occupational History  . Not on file  Tobacco Use  . Smoking status: Never Smoker  . Smokeless tobacco: Never Used  Substance and Sexual Activity  . Alcohol use: Yes    Alcohol/week: 1.0 standard drinks    Types: 1 Cans of beer per week  .  Drug use: Not Currently    Types: Marijuana  . Sexual activity: Yes    Birth control/protection: None  Other Topics Concern  . Not on file  Social History Narrative  . Not on file   Social Determinants of Health   Financial Resource Strain:   . Difficulty of Paying Living Expenses: Not on file  Food Insecurity:   . Worried About Charity fundraiser in the Last Year: Not on file  . Ran Out of Food in the Last Year: Not on file  Transportation Needs:   . Lack of Transportation (Medical): Not on file  . Lack of Transportation (Non-Medical): Not on file  Physical Activity:   . Days of Exercise per Week: Not on file  . Minutes of Exercise per Session: Not on file  Stress:   . Feeling of Stress :  Not on file  Social Connections:   . Frequency of Communication with Friends and Family: Not on file  . Frequency of Social Gatherings with Friends and Family: Not on file  . Attends Religious Services: Not on file  . Active Member of Clubs or Organizations: Not on file  . Attends Archivist Meetings: Not on file  . Marital Status: Not on file  Intimate Partner Violence:   . Fear of Current or Ex-Partner: Not on file  . Emotionally Abused: Not on file  . Physically Abused: Not on file  . Sexually Abused: Not on file    Outpatient Medications Prior to Visit  Medication Sig Dispense Refill  . ALPRAZolam (XANAX) 0.5 MG tablet Take 0.5-1 tablets (0.25-0.5 mg total) by mouth 2 (two) times daily as needed for anxiety. 15 tablet 0  . tadalafil (CIALIS) 10 MG tablet Take 1 tablet (10 mg total) by mouth daily. 90 tablet 0   No facility-administered medications prior to visit.    Allergies  Allergen Reactions  . Amoxicillin Swelling    Patient states amoxicillin causes swelling one time. Has had regular penicillin multiple times since then without reaction.  . Codeine     "throw up"    Review of Systems  Constitutional: Positive for fatigue. Negative for appetite change.  Psychiatric/Behavioral: Positive for agitation and sleep disturbance. Negative for decreased concentration, self-injury and suicidal ideas. The patient is nervous/anxious and is hyperactive.   All other systems reviewed and are negative.        Objective:    Physical Exam Vitals and nursing note reviewed.  Constitutional:      Appearance: Normal appearance.  Cardiovascular:     Rate and Rhythm: Normal rate and regular rhythm.     Pulses: Normal pulses.  Pulmonary:     Effort: Pulmonary effort is normal.     Breath sounds: Normal breath sounds.  Musculoskeletal:        General: Normal range of motion.  Skin:    General: Skin is warm and dry.  Neurological:     Mental Status: He is alert.   Psychiatric:        Attention and Perception: Attention normal.        Mood and Affect: Mood is anxious.        Behavior: Behavior is cooperative.        Thought Content: Thought content normal.        Cognition and Memory: Cognition and memory normal.    There were no vitals taken for this visit. Wt Readings from Last 3 Encounters:  01/17/19 185 lb 0.6 oz (83.9 kg)  12/13/18 183 lb 0.6 oz (83 kg)  03/25/18 189 lb (85.7 kg)    Health Maintenance Due  Topic Date Due  . Fecal DNA (Cologuard)  02/26/2016    There are no preventive care reminders to display for this patient.   Lab Results  Component Value Date   TSH 1.414 02/09/2007   Lab Results  Component Value Date   WBC 11.3 (H) 07/13/2017   HGB 13.1 07/13/2017   HCT 39.8 07/13/2017   MCV 80 07/13/2017   PLT 287 07/01/2017   Lab Results  Component Value Date   NA 140 07/01/2017   K 4.5 07/01/2017   CO2 26 07/01/2017   GLUCOSE 117 (H) 07/01/2017   BUN 19 07/01/2017   CREATININE 1.15 07/01/2017   BILITOT 0.4 07/01/2017   ALKPHOS 46 05/31/2010   AST 17 07/01/2017   ALT 14 07/01/2017   PROT 7.8 07/01/2017   ALBUMIN 4.2 05/31/2010   CALCIUM 9.9 07/01/2017   ANIONGAP 10 05/27/2015   Lab Results  Component Value Date   CHOL 187 06/15/2007   Lab Results  Component Value Date   HDL 39 (L) 06/15/2007   Lab Results  Component Value Date   LDLCALC 99 06/15/2007   Lab Results  Component Value Date   TRIG 247 (H) 06/15/2007   Lab Results  Component Value Date   CHOLHDL 4.8 Ratio 06/15/2007   Lab Results  Component Value Date   HGBA1C 5.4 07/01/2017       Assessment & Plan:   1. Insomnia due to other mental disorder Unable to refill xanax prescription today- 01/17/19 Dr. Lyn Hollingshead wrote instructions for 15 pills to last 90 days.    - hydrOXYzine (ATARAX/VISTARIL) 25 MG tablet; Take 1 tablet (25 mg total) by mouth at bedtime and may repeat dose one time if needed.  Dispense: 60 tablet; Refill:  0  2. GAD (generalized anxiety disorder) Discussed with the patient, at length, the benefits of daily low dose SSRI, such as sertraline, for anxiety. The patient is resistant to daily SSRI/SSNI due to presumed side effects. He is considering an over-the-counter medication he has researched that reportedly to increase mood and decrease anxiety (5HP).    Unable to refill xanax prescription- pt to follow-up with Dr. Lyn Hollingshead later this month to discuss long term options. Script for hydralazine sent as an alternative to help with anxiety and sleep.  I highly recommend the pt consider CBT or Al-Anon to help with coping mechanisms and strategies to manage his anxiety associated with his daughters addictions and behaviors. Information was provided on this. I encouraged him to look this up when he gets home and discuss with Dr. Lyn Hollingshead at their next appointment.   - hydrOXYzine (ATARAX/VISTARIL) 25 MG tablet; Take 1 tablet (25 mg total) by mouth at bedtime and may repeat dose one time if needed.  Dispense: 60 tablet; Refill: 0  3. Elevated blood pressure reading Pt anxious today. Follow-up needed at next visit to address this if ongoing.   Return if symptoms worsen or fail to improve.  Tollie Eth, NP

## 2019-03-04 LAB — COMPLETE METABOLIC PANEL WITH GFR
AG Ratio: 2.2 (calc) (ref 1.0–2.5)
ALT: 26 U/L (ref 9–46)
AST: 23 U/L (ref 10–35)
Albumin: 5.1 g/dL (ref 3.6–5.1)
Alkaline phosphatase (APISO): 61 U/L (ref 35–144)
BUN: 22 mg/dL (ref 7–25)
CO2: 26 mmol/L (ref 20–32)
Calcium: 9.9 mg/dL (ref 8.6–10.3)
Chloride: 104 mmol/L (ref 98–110)
Creat: 1.08 mg/dL (ref 0.70–1.33)
GFR, Est African American: 90 mL/min/{1.73_m2} (ref >=60)
GFR, Est Non African American: 78 mL/min/{1.73_m2} (ref >=60)
Globulin: 2.3 g/dL (calc) (ref 1.9–3.7)
Glucose, Bld: 95 mg/dL (ref 65–99)
Potassium: 4.6 mmol/L (ref 3.5–5.3)
Sodium: 141 mmol/L (ref 135–146)
Total Bilirubin: 0.6 mg/dL (ref 0.2–1.2)
Total Protein: 7.4 g/dL (ref 6.1–8.1)

## 2019-03-04 LAB — LIPID PANEL
Cholesterol: 194 mg/dL (ref <200)
HDL: 35 mg/dL — ABNORMAL LOW (ref >=40)
Non-HDL Cholesterol (Calc): 159 mg/dL (calc) — ABNORMAL HIGH (ref <130)
Total CHOL/HDL Ratio: 5.5 (calc) — ABNORMAL HIGH (ref <5.0)
Triglycerides: 517 mg/dL — ABNORMAL HIGH (ref <150)

## 2019-03-04 LAB — CBC
HCT: 47.7 % (ref 38.5–50.0)
Hemoglobin: 16.1 g/dL (ref 13.2–17.1)
MCH: 29.2 pg (ref 27.0–33.0)
MCHC: 33.8 g/dL (ref 32.0–36.0)
MCV: 86.6 fL (ref 80.0–100.0)
MPV: 9.4 fL (ref 7.5–12.5)
Platelets: 205 10*3/uL (ref 140–400)
RBC: 5.51 10*6/uL (ref 4.20–5.80)
RDW: 12.9 % (ref 11.0–15.0)
WBC: 7.2 10*3/uL (ref 3.8–10.8)

## 2019-03-04 LAB — TSH: TSH: 1.32 mIU/L (ref 0.40–4.50)

## 2019-03-04 LAB — TESTOSTERONE: Testosterone: 280 ng/dL (ref 250–827)

## 2019-03-17 ENCOUNTER — Other Ambulatory Visit: Payer: Self-pay | Admitting: Osteopathic Medicine

## 2019-04-19 ENCOUNTER — Ambulatory Visit: Payer: 59 | Admitting: Osteopathic Medicine

## 2019-04-20 ENCOUNTER — Ambulatory Visit (INDEPENDENT_AMBULATORY_CARE_PROVIDER_SITE_OTHER): Payer: 59 | Admitting: Osteopathic Medicine

## 2019-04-20 ENCOUNTER — Other Ambulatory Visit: Payer: Self-pay | Admitting: *Deleted

## 2019-04-20 ENCOUNTER — Other Ambulatory Visit: Payer: Self-pay

## 2019-04-20 ENCOUNTER — Encounter: Payer: Self-pay | Admitting: Osteopathic Medicine

## 2019-04-20 VITALS — BP 130/80 | HR 101 | Ht 68.0 in | Wt 189.0 lb

## 2019-04-20 DIAGNOSIS — F411 Generalized anxiety disorder: Secondary | ICD-10-CM

## 2019-04-20 MED ORDER — TADALAFIL 10 MG PO TABS: 10.0000 mg | ORAL_TABLET | Freq: Every day | ORAL | 0 refills | Status: DC

## 2019-04-20 MED ORDER — ALPRAZOLAM 0.5 MG PO TABS: 0.2500 mg | ORAL_TABLET | Freq: Two times a day (BID) | ORAL | 1 refills | Status: DC | PRN

## 2019-04-20 NOTE — Progress Notes (Signed)
Jacob Mcmillan is a 54 y.o. male who presents to  [place] at [facility]  today, 04/20/19, seeking care for the following: . Axneity     ASSESSMENT & PLAN with other pertinent history/findings:  The encounter diagnosis was GAD (generalized anxiety disorder). Long discussion of risk/benefits of meds daily SSRI vs prn Benzo  Pt reluctant to trial SSRI despite recommendations  I advised ok to increase prn Benzo to #30 for 90 days but will increase no further        No orders of the defined types were placed in this encounter.   Meds ordered this encounter  Medications  . ALPRAZolam (XANAX) 0.5 MG tablet    Sig: Take 0.5-1 tablets (0.25-0.5 mg total) by mouth 2 (two) times daily as needed for anxiety. #30 FOR 90 DAYS    Dispense:  30 tablet    Refill:  1   Depression screen [facility] 2/9 03/03/2019 03/25/2018 05/28/2016  Decreased Interest 1 1 0  Down, Depressed, Hopeless 0 0 0  PHQ - 2 Score 1 1 0  Altered sleeping 2 - -  Tired, decreased energy 0 - -  Change in appetite 0 - -  Feeling bad or failure about yourself  0 - -  Trouble concentrating 0 - -  Moving slowly or fidgety/restless 0 - -  Suicidal thoughts 0 - -  PHQ-9 Score 3 - -  Difficult doing work/chores Somewhat difficult - -   GAD 7 : Generalized Anxiety Score 03/03/2019 03/25/2018 06/12/2016 05/28/2016  Nervous, Anxious, on Edge 0 1 1 2   Control/stop worrying 1 1 1 3   Worry too much - different things 1 1 1 2   Trouble relaxing 1 1 1 2   Restless 1 1 1 2   Easily annoyed or irritable 1 1 1 2   Afraid - awful might happen 2 0 0 1  Total GAD 7 Score 7 6 6 14   Anxiety Difficulty Somewhat difficult Somewhat difficult - -         Follow-up instructions: Return in about 6 months (around 10/18/2019) for recheck mental health, see me sooner if needed .                                         There were no vitals taken for this  visit.  No outpatient medications have been marked as taking for the 04/20/19 encounter (Appointment) with Emeterio Reeve, DO.    No results found for this or any previous visit (from the past 72 hour(s)).  No results found.  Depression screen [facility] 2/9 03/03/2019 03/25/2018 05/28/2016  Decreased Interest 1 1 0  Down, Depressed, Hopeless 0 0 0  PHQ - 2 Score 1 1 0  Altered sleeping 2 - -  Tired, decreased energy 0 - -  Change in appetite 0 - -  Feeling bad or failure about yourself  0 - -  Trouble concentrating 0 - -  Moving slowly or fidgety/restless 0 - -  Suicidal thoughts 0 - -  PHQ-9 Score 3 - -  Difficult doing work/chores Somewhat difficult - -    GAD 7 : Generalized Anxiety Score 03/03/2019 03/25/2018 06/12/2016 05/28/2016  Nervous, Anxious, on Edge 0 1 1 2   Control/stop worrying 1 1 1 3   Worry too much - different things 1 1 1 2   Trouble relaxing 1 1 1  2  Restless 1 1 1 2   Easily annoyed or irritable 1 1 1 2   Afraid - awful might happen 2 0 0 1  Total GAD 7 Score 7 6 6 14   Anxiety Difficulty Somewhat difficult Somewhat difficult - -      All questions at time of visit were answered - patient instructed to contact office with any additional concerns or updates.  ER/RTC precautions were reviewed with the patient.  Please note: voice recognition software was used to produce this document, and typos may escape review. Please contact Dr. for any needed clarifications.   Total encounter time: 30 minutes.

## 2019-09-04 ENCOUNTER — Other Ambulatory Visit: Payer: Self-pay | Admitting: Osteopathic Medicine

## 2019-09-04 DIAGNOSIS — F411 Generalized anxiety disorder: Secondary | ICD-10-CM

## 2019-09-05 NOTE — Telephone Encounter (Signed)
Last refill-07/01/2019 Last Ov-04/20/2019

## 2019-09-27 IMAGING — DX DG CHEST 2V
2 series · 2 of 2 positions shown · non-contrast
Comparison: Chest x-ray of 03/23/2017

CLINICAL DATA: Night sweats, chills, fever, cough

EXAM:
CHEST - 2 VIEW

[chest pa]
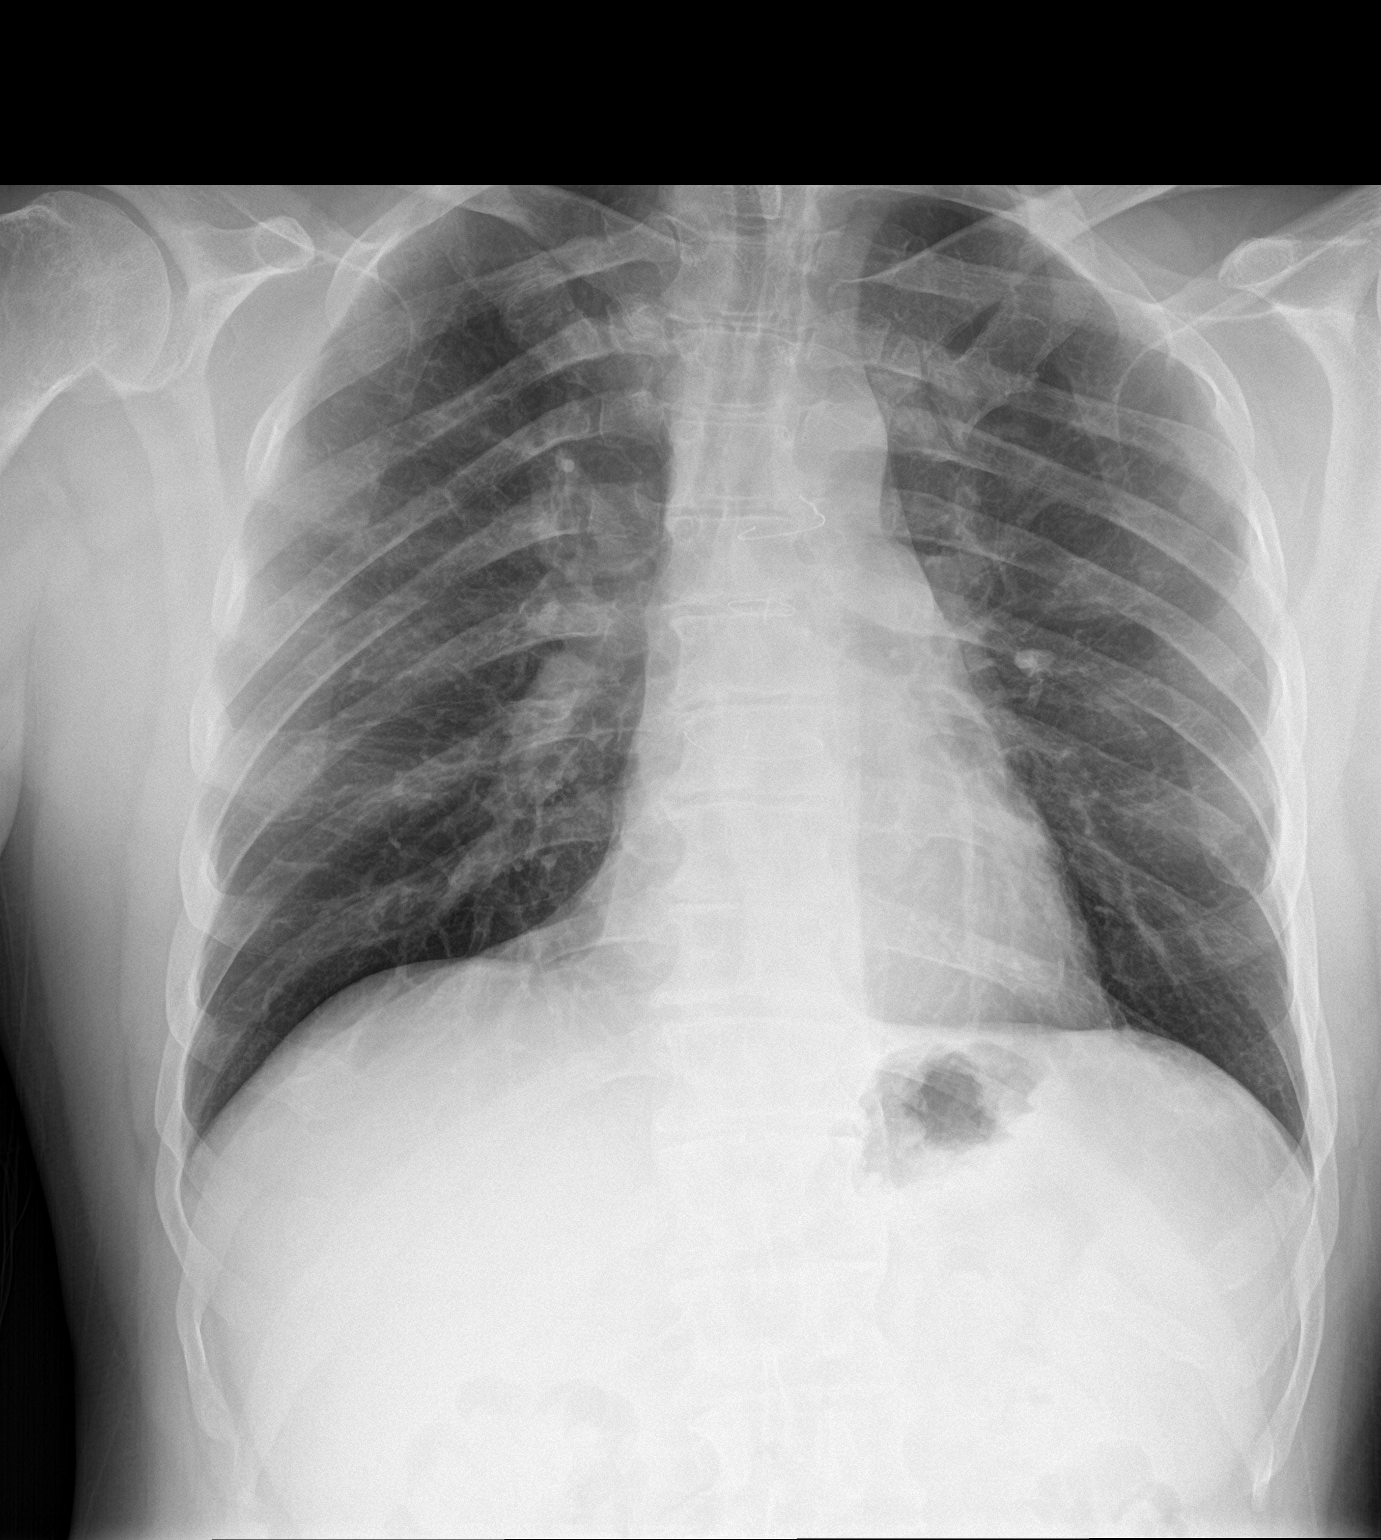

[chest lat]
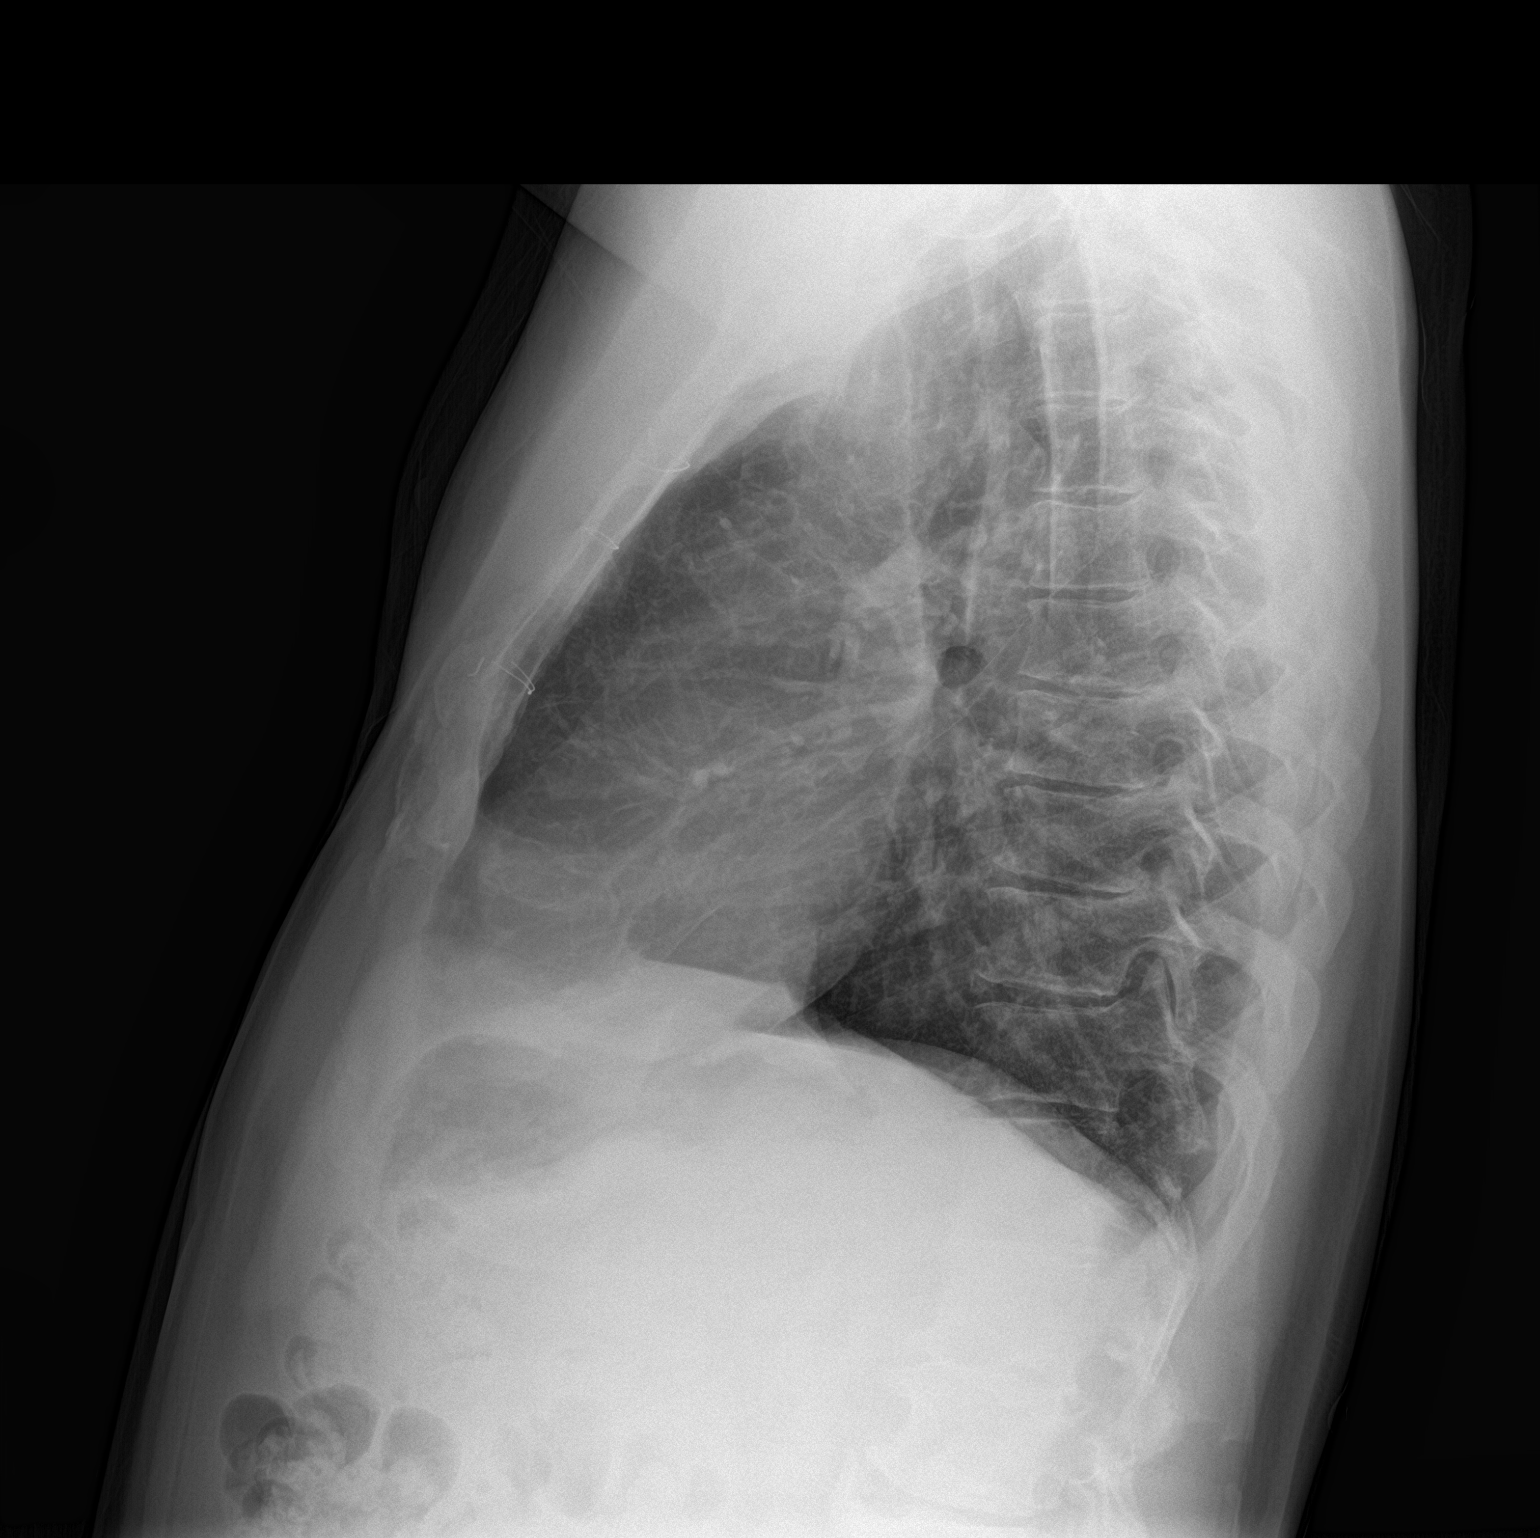

[2 of 2 positions shown; findings below may reference images not displayed]

FINDINGS: No active infiltrate or effusion is seen. Mediastinal and hilar
contours are unremarkable. The heart is within normal limits in
size. Sutures are noted overlying the mediastinum. No acute bony
abnormality is seen.
IMPRESSION: No active cardiopulmonary disease.

## 2019-10-18 ENCOUNTER — Encounter: Payer: Self-pay | Admitting: Osteopathic Medicine

## 2019-10-18 ENCOUNTER — Other Ambulatory Visit: Payer: Self-pay

## 2019-10-18 ENCOUNTER — Ambulatory Visit (INDEPENDENT_AMBULATORY_CARE_PROVIDER_SITE_OTHER): Payer: 59 | Admitting: Osteopathic Medicine

## 2019-10-18 VITALS — BP 139/84 | HR 110 | Wt 180.0 lb

## 2019-10-18 DIAGNOSIS — R7989 Other specified abnormal findings of blood chemistry: Secondary | ICD-10-CM | POA: Diagnosis not present

## 2019-10-18 DIAGNOSIS — N529 Male erectile dysfunction, unspecified: Secondary | ICD-10-CM | POA: Diagnosis not present

## 2019-10-18 DIAGNOSIS — F411 Generalized anxiety disorder: Secondary | ICD-10-CM

## 2019-10-18 MED ORDER — ALPRAZOLAM 0.5 MG PO TABS: ORAL_TABLET | ORAL | 0 refills | Status: DC

## 2019-10-18 MED ORDER — TADALAFIL 10 MG PO TABS: 10.0000 mg | ORAL_TABLET | Freq: Every day | ORAL | 0 refills | Status: DC

## 2019-10-18 NOTE — Patient Instructions (Addendum)
General Preventive Care  Most recent routine screening labs: ordered today.   Tobacco: don't! Alcohol: responsible moderation is ok for most adults - if you have concerns about your alcohol intake, please talk to me!   Exercise: as tolerated to reduce risk of cardiovascular disease and diabetes. Strength training will also prevent osteoporosis.   Mental health: if need for mental health care (medicines, counseling, other), or concerns about moods, please let me know!   Sexual / Reproductive health: if need for STD testing, or if concerns with libido/pain problems, please let me know!   Advanced Directive: Living Will and/or Healthcare Power of Attorney recommended for all adults, regardless of age or health.  Vaccines   Flu vaccine: every fall.   Shingles vaccine: after age 7.   Pneumonia vaccines: after age 1.  Tetanus booster: every 10 years, done 04/08/2016  COVID vaccine: STRONGLY RECOMMENDED  Cancer screenings   Colon cancer screening: for everyone age 19-75. Colonoscopy available for all, many people also qualify for the Cologuard stool test   Prostate cancer screening: PSA blood test age 65-71  Lung cancer screening: not needed for non-smokers  Infection screenings  . HIV: recommended screening at least once age 46-65, more often as needed. . Gonorrhea/Chlamydia: screening as needed . Hepatitis C: recommended once for everyone age 40-75 . TB: certain at-risk populations, or depending on work requirements and/or travel history Other . Bone Density Test: recommended for men at age 36 . Abdominal Aortic Aneurysm: screening with ultrasound recommended once for men age 24-75 who have ever smoked

## 2019-10-18 NOTE — Progress Notes (Signed)
Jacob Mcmillan is a 54 y.o. male who presents to  [facility] at [facility]  today, 10/18/19, seeking care for the following:  Follow up mental health. Last visit for this 03/31/2019 we had a discussion of risk/benefits of meds daily SSRI vs prn Benzo. At that time, pt reluctant to trial SSRI despite recommendations that this is first line treatment for his condition. I advised ok to increase prn Benzo to #30 for 90 days but will increase no further. PDMP reviewed - has filled alprazolam 0.5 mg #30 on 04/20/19, 07/01/19, 09/05/19. Pt states doing well on this, still some anxiety, he does not want to be on daily preventive Rx for this.   Concerned about low testosterone and would like checked for this.    GAD 7 : Generalized Anxiety Score 10/18/2019 04/20/2019 03/03/2019 03/25/2018  Nervous, Anxious, on Edge 1 1 0 1  Control/stop worrying 1 1 1 1   Worry too much - different things 1 1 1 1   Trouble relaxing 1 1 1 1   Restless 1 0 1 1  Easily annoyed or irritable 1 1 1 1   Afraid - awful might happen 1 0 2 0  Total GAD 7 Score 7 5 7 6   Anxiety Difficulty Somewhat difficult - Somewhat difficult Somewhat difficult    Depression screen [facility] 2/9 10/18/2019 04/20/2019 03/03/2019  Decreased Interest 0 0 1  Down, Depressed, Hopeless 1 0 0  PHQ - 2 Score 1 0 1  Altered sleeping 2 2 2   Tired, decreased energy 0 1 0  Change in appetite 0 0 0  Feeling bad or failure about yourself  0 1 0  Trouble concentrating 0 0 0  Moving slowly or fidgety/restless 0 0 0  Suicidal thoughts 0 0 0  PHQ-9 Score 3 4 3   Difficult doing work/chores - - Somewhat difficult      ASSESSMENT & PLAN with other pertinent findings:  The primary encounter diagnosis was Low testosterone. Diagnoses of GAD (generalized anxiety disorder) and Erectile dysfunction, unspecified erectile dysfunction type were also pertinent to this visit.   No results found for this or any previous visit  (from the past 24 hour(s)).     Patient Instructions  General Preventive Care  Most recent routine screening labs: ordered today.   Tobacco: don't! Alcohol: responsible moderation is ok for most adults - if you have concerns about your alcohol intake, please talk to me!   Exercise: as tolerated to reduce risk of cardiovascular disease and diabetes. Strength training will also prevent osteoporosis.   Mental health: if need for mental health care (medicines, counseling, other), or concerns about moods, please let me know!   Sexual / Reproductive health: if need for STD testing, or if concerns with libido/pain problems, please let me know!   Advanced Directive: Living Will and/or Healthcare Power of Attorney recommended for all adults, regardless of age or health.  Vaccines   Flu vaccine: every fall.   Shingles vaccine: after age 31.   Pneumonia vaccines: after age 68.  Tetanus booster: every 10 years, done 04/08/2016  COVID vaccine: STRONGLY RECOMMENDED  Cancer screenings   Colon cancer screening: for everyone age 26-75. Colonoscopy available for all, many people also qualify for the Cologuard stool test   Prostate cancer screening: PSA blood test age 2-71  Lung cancer screening: not needed for non-smokers  Infection screenings  . HIV: recommended screening at least once age 32-65, more often as needed. . Gonorrhea/Chlamydia: screening  as needed . Hepatitis C: recommended once for everyone age 73-75 . TB: certain at-risk populations, or depending on work requirements and/or travel history Other . Bone Density Test: recommended for men at age 22 . Abdominal Aortic Aneurysm: screening with ultrasound recommended once for men age 3-75 who have ever smoked     Orders Placed This Encounter  Procedures  . Testosterone  . COMPLETE METABOLIC PANEL WITH GFR  . PSA, Total with Reflex to PSA, Free    Meds ordered this encounter  Medications  . ALPRAZolam (XANAX) 0.5 MG  tablet    Sig: TAKE 0.5-1 TABLET BY MOUTH TWO TIMES A DAY AS NEEDED FOR ANXIETY. 30 FOR 90 DAYS SUPPLY    Dispense:  30 tablet    Refill:  0  . tadalafil (CIALIS) 10 MG tablet    Sig: Take 1 tablet (10 mg total) by mouth daily.    Dispense:  90 tablet    Refill:  0       Follow-up instructions: Return for LABS FASTING ANY WEEKDAY 7-5 DOWNSTAIRS .                                         BP 139/84 (BP Location: Left Arm, Patient Position: Sitting)   Pulse (!) 110   Wt 180 lb (81.6 kg)   SpO2 97%   BMI 27.37 kg/m   No outpatient medications have been marked as taking for the 10/18/19 encounter (Office Visit) with Sunnie Nielsen, DO.    No results found for this or any previous visit (from the past 72 hour(s)).  No results found.     All questions at time of visit were answered - patient instructed to contact office with any additional concerns or updates.  ER/RTC precautions were reviewed with the patient as applicable.   Please note: voice recognition software was used to produce this document, and typos may escape review. Please contact Dr. Lyn Hollingshead for any needed clarifications.   Total encounter time: 30 minutes.

## 2020-01-11 ENCOUNTER — Other Ambulatory Visit: Payer: Self-pay | Admitting: Osteopathic Medicine

## 2020-01-11 DIAGNOSIS — F411 Generalized anxiety disorder: Secondary | ICD-10-CM

## 2020-03-28 ENCOUNTER — Ambulatory Visit (INDEPENDENT_AMBULATORY_CARE_PROVIDER_SITE_OTHER): Payer: 59 | Admitting: Sports Medicine

## 2020-03-28 ENCOUNTER — Encounter: Payer: Self-pay | Admitting: Sports Medicine

## 2020-03-28 ENCOUNTER — Other Ambulatory Visit: Payer: Self-pay | Admitting: Osteopathic Medicine

## 2020-03-28 ENCOUNTER — Other Ambulatory Visit: Payer: Self-pay

## 2020-03-28 DIAGNOSIS — F411 Generalized anxiety disorder: Secondary | ICD-10-CM

## 2020-03-28 DIAGNOSIS — M720 Palmar fascial fibromatosis [Dupuytren]: Secondary | ICD-10-CM

## 2020-03-28 DIAGNOSIS — N529 Male erectile dysfunction, unspecified: Secondary | ICD-10-CM

## 2020-03-28 DIAGNOSIS — G5601 Carpal tunnel syndrome, right upper limb: Secondary | ICD-10-CM | POA: Insufficient documentation

## 2020-03-28 MED ORDER — GABAPENTIN 300 MG PO CAPS: ORAL_CAPSULE | ORAL | 3 refills | Status: DC

## 2020-03-28 MED ORDER — PREDNISONE 50 MG PO TABS: ORAL_TABLET | ORAL | 0 refills | Status: DC

## 2020-03-28 NOTE — Assessment & Plan Note (Signed)
Right-sided Dupuytren's contracture. We will address this at a follow-up visit once we relieve the pain from his carpal tunnel syndrome.

## 2020-03-28 NOTE — Progress Notes (Signed)
    Procedures performed today:    None.  Independent interpretation of notes and tests performed by another provider:   None.  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 Unfortunately over the past several weeks to months he had increasing pain on the volar aspect of his right hand with numbness and tingling into the third and fourth fingers. He is also noted some swelling on his palm. On exam he has tenderness over the volar aspect of her wrist with a positive Tinel's sign consistent with carpal tunnel syndrome. Adding 5 days of prednisone, gabapentin at night, night splints, return to see me in 1 month, median nerve Hydro dissection if no better.  Dupuytren's disease of palm of right hand Right-sided Dupuytren's contracture. We will address this at a follow-up visit once we relieve the pain from his carpal tunnel syndrome.    ___________________________________________ Jacob Mcmillan. Benjamin Stain, M.D., ABFM., CAQSM. Primary Care and Sports Medicine [facility]  Adjunct Instructor of Family Medicine  [employer]

## 2020-03-28 NOTE — Assessment & Plan Note (Signed)
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 Unfortunately over the past several weeks to months he had increasing pain on the volar aspect of his right hand with numbness and tingling into the third and fourth fingers. He is also noted some swelling on his palm. On exam he has tenderness over the volar aspect of her wrist with a positive Tinel's sign consistent with carpal tunnel syndrome. Adding 5 days of prednisone, gabapentin at night, night splints, return to see me in 1 month, median nerve Hydro dissection if no better.

## 2020-04-01 ENCOUNTER — Ambulatory Visit: Payer: 59 | Admitting: Sports Medicine

## 2020-04-03 ENCOUNTER — Ambulatory Visit (INDEPENDENT_AMBULATORY_CARE_PROVIDER_SITE_OTHER): Payer: 59 | Admitting: Sports Medicine

## 2020-04-03 ENCOUNTER — Other Ambulatory Visit: Payer: Self-pay

## 2020-04-03 DIAGNOSIS — G5601 Carpal tunnel syndrome, right upper limb: Secondary | ICD-10-CM | POA: Diagnosis not present

## 2020-04-03 DIAGNOSIS — M720 Palmar fascial fibromatosis [Dupuytren]: Secondary | ICD-10-CM

## 2020-04-03 NOTE — Progress Notes (Signed)
    Procedures performed today:    None.  Independent interpretation of notes and tests performed by another provider:   None.  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 He does have tenderness over the volar aspect of the wrist with a positive Tinel's sign consistent with carpal tunnel syndrome, prednisone improved his symptoms to some degree as well as gabapentin, and he continues with his splinting. We filled out his disability paperwork today. I would like to see him back in about 3 weeks for consideration of median nerve hydrodissection if not sufficiently better.  Dupuytren's disease of palm of right hand As before right-sided Dupuytren's contracture, we will address the pain from his carpal tunnel first before considering management of his Dupuytren's contracture which is typically asymptomatic however it can cause significant loss of function and a flexion deformity at the palmar aponeurosis and fingers. At this point I do not think he can do right-sided duty. Ultimately he may need to consider other than usual work after the disability period.     ___________________________________________ Jacob Mcmillan. Jacob Mcmillan, M.D., ABFM., CAQSM. Primary Care and Sports Medicine [facility]  Adjunct Instructor of Family Medicine  [employer]

## 2020-04-03 NOTE — Assessment & Plan Note (Addendum)
As before right-sided Dupuytren's contracture, we will address the pain from his carpal tunnel first before considering management of his Dupuytren's contracture which is typically asymptomatic however it can cause significant loss of function and a flexion deformity at the palmar aponeurosis and fingers. At this point I do not think he can do right-sided duty. Ultimately he may need to consider other than usual work after the disability period.

## 2020-04-03 NOTE — Assessment & Plan Note (Signed)
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 He does have tenderness over the volar aspect of the wrist with a positive Tinel's sign consistent with carpal tunnel syndrome, prednisone improved his symptoms to some degree as well as gabapentin, and he continues with his splinting. We filled out his disability paperwork today. I would like to see him back in about 3 weeks for consideration of median nerve hydrodissection if not sufficiently better.

## 2020-04-23 ENCOUNTER — Other Ambulatory Visit: Payer: Self-pay

## 2020-04-23 ENCOUNTER — Ambulatory Visit (INDEPENDENT_AMBULATORY_CARE_PROVIDER_SITE_OTHER): Payer: 59 | Admitting: Osteopathic Medicine

## 2020-04-23 ENCOUNTER — Encounter: Payer: Self-pay | Admitting: Osteopathic Medicine

## 2020-04-23 VITALS — BP 150/92 | HR 82 | Temp 98.1°F | Wt 190.0 lb

## 2020-04-23 DIAGNOSIS — K047 Periapical abscess without sinus: Secondary | ICD-10-CM | POA: Diagnosis not present

## 2020-04-23 DIAGNOSIS — F320 Major depressive disorder, single episode, mild: Secondary | ICD-10-CM

## 2020-04-23 DIAGNOSIS — N529 Male erectile dysfunction, unspecified: Secondary | ICD-10-CM | POA: Diagnosis not present

## 2020-04-23 MED ORDER — CHLORHEXIDINE GLUCONATE 0.12 % MT SOLN: 15.0000 mL | Freq: Two times a day (BID) | OROMUCOSAL | 1 refills | Status: DC

## 2020-04-23 MED ORDER — CLINDAMYCIN HCL 300 MG PO CAPS: 300.0000 mg | ORAL_CAPSULE | Freq: Three times a day (TID) | ORAL | 0 refills | Status: DC

## 2020-04-23 MED ORDER — BUPROPION HCL ER (XL) 150 MG PO TB24: 150.0000 mg | ORAL_TABLET | ORAL | 0 refills | Status: DC

## 2020-04-23 NOTE — Patient Instructions (Signed)
Teeth: Antibiotic pills and mouth wash sent  Be sure to keep your dentist appointment!   Mental health: Starting Welbutrin antidepressant  This won't mess with "male stuff" at all Let's follow-up in a month to see how you're doing on this!

## 2020-04-23 NOTE — Progress Notes (Signed)
Jacob Mcmillan is a 55 y.o. male who presents to  [facility]  today, 04/23/20, seeking care for the following:  . Dental infection - has dental appt later this week, upper R teeth have been bothering him a few months, worse past week to where he is having pain shooting up into cheek. NO fever, normal neck ROM, no trismus/dysphagia . Depression/stress - requests Rx that will not affect sexual function. Increased stress lately, trouble staying motivated, feeling overwhelmed, denies SI.      ASSESSMENT & PLAN with other pertinent findings:  The primary encounter diagnosis was Dental infection. Diagnoses of Current mild episode of major depressive disorder without prior episode (HCC) and Erectile dysfunction, unspecified erectile dysfunction type were also pertinent to this visit.    Patient Instructions  Teeth: Antibiotic pills and mouth wash sent  Be sure to keep your dentist appointment!   Mental health: Starting Welbutrin antidepressant  This won't mess with "male stuff" at all Let's follow-up in a month to see how you're doing on this!      No orders of the defined types were placed in this encounter.   Meds ordered this encounter  Medications  . clindamycin (CLEOCIN) 300 MG capsule    Sig: Take 1 capsule (300 mg total) by mouth 3 (three) times daily.    Dispense:  21 capsule    Refill:  0  . chlorhexidine (PERIDEX) 0.12 % solution    Sig: Use as directed 15 mLs in the mouth or throat 2 (two) times daily. Use up to 4 times daily if dental pain / gum pain    Dispense:  473 mL    Refill:  1  . buPROPion (WELLBUTRIN XL) 150 MG 24 hr tablet    Sig: Take 1 tablet (150 mg total) by mouth every morning.    Dispense:  90 tablet    Refill:  0     See below for relevant physical exam findings  See below for recent lab and imaging results reviewed  Medications, allergies, PMH, PSH, SocH, FamH reviewed  below    Follow-up instructions: Return in about 4 weeks (around 05/21/2020) for RECHECK MENTAL HEALTH ON NEW MEDICATION.                                        Exam:  BP (!) 150/92 (BP Location: Left Arm, Patient Position: Sitting, Cuff Size: Normal)   Pulse 82   Temp 98.1 F (36.7 C) (Oral)   Wt 190 lb 0.6 oz (86.2 kg)   BMI 28.90 kg/m   Constitutional: VS see above. General Appearance: alert, well-developed, well-nourished, NAD  Neck: No masses, trachea midline. Normal active ROM to SB/Flex/Ext/Rot  Dental: poor dentition, gingivitis R upper jaw, several missing teeth, significan caries, no abscess/purulent drainage   Respiratory: Normal respiratory effort.   Musculoskeletal: Gait normal.   Neurological: Normal balance/coordination. No tremor.  Skin: warm, dry, intact.   Psychiatric: Normal judgment/insight. Normal mood and affect. Oriented x3.   Current Meds  Medication Sig  . ALPRAZolam (XANAX) 0.5 MG tablet TAKE 1/2 TO 1 TABLET BY MOUTH 2 TIMES DAILY AS NEEDED FOR ANXIETY. *THIS IS 90 DAY SUPPLY*  . buPROPion (WELLBUTRIN XL) 150 MG 24 hr tablet Take 1 tablet (150 mg total) by mouth every morning.  . chlorhexidine (PERIDEX) 0.12 % solution Use as directed  15 mLs in the mouth or throat 2 (two) times daily. Use up to 4 times daily if dental pain / gum pain  . clindamycin (CLEOCIN) 300 MG capsule Take 1 capsule (300 mg total) by mouth 3 (three) times daily.  Marland Kitchen gabapentin (NEURONTIN) 300 MG capsule One tab PO qHS  . predniSONE (DELTASONE) 50 MG tablet One tab PO daily for 5 days.  . tadalafil (CIALIS) 10 MG tablet TAKE ONE TABLET BY MOUTH DAILY    Allergies  Allergen Reactions  . Amoxicillin Swelling    Patient states amoxicillin causes swelling one time. Has had regular penicillin multiple times since then without reaction.  . Codeine     "throw up"    Patient Active Problem List   Diagnosis Date Noted  . Carpal tunnel  syndrome, right 03/28/2020  . Dupuytren's disease of palm of right hand 03/28/2020  . Low libido 03/25/2018  . Mild persistent asthma 07/13/2017  . Nonallergic rhinitis 07/13/2017  . Cough, persistent 07/13/2017  . Fever of unknown origin (FUO) 06/24/2017  . Chronic night sweats 06/24/2017  . Environmental allergies 06/24/2017  . Arthritis of left acromioclavicular joint 03/09/2017  . Swelling of left hand 01/08/2017  . Insomnia due to other mental disorder 06/12/2016  . White coat syndrome without diagnosis of hypertension 05/28/2016  . Attention deficit hyperactivity disorder (ADHD), combined type 04/08/2016  . GAD (generalized anxiety disorder) 04/08/2016  . 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 Elevated blood pressure reading 04/08/2016  . Pain of left sternoclavicular joint 02/18/2016  . Impingement syndrome of left shoulder 02/18/2016    Family History  Problem Relation Age of Onset  . Depression Mother   . Hyperlipidemia Mother   . Hypertension Father   . Hyperlipidemia Father   . Alcohol abuse Father   . Depression Sister   . Melanoma Sister   . Lung cancer Paternal Aunt   . Lung cancer Paternal Uncle   . Heart attack Maternal Uncle     Social History   Tobacco Use  Smoking Status Never Smoker  Smokeless Tobacco Never Used    Past Surgical History:  Procedure Laterality Date  . ARTERY REPAIR     as a child  . BACK SURGERY    . DENTAL RESTORATION/EXTRACTION WITH X-RAY    . SPINE SURGERY      Immunization History  Administered Date(s) Administered  . Tdap 04/08/2016    No results found for this or any previous visit (from the past 2160 hour(s)).  No results found.     All questions at time of visit were answered - patient instructed to contact office with any additional concerns or updates. ER/RTC precautions were reviewed with the patient as applicable.   Please note: manual typing as well as voice recognition software may have been  used to produce this document - typos may escape review. Please contact Dr. Lyn Hollingshead for any needed clarifications.

## 2020-04-25 ENCOUNTER — Other Ambulatory Visit: Payer: Self-pay

## 2020-04-25 ENCOUNTER — Ambulatory Visit (INDEPENDENT_AMBULATORY_CARE_PROVIDER_SITE_OTHER): Payer: 59 | Admitting: Sports Medicine

## 2020-04-25 DIAGNOSIS — G5601 Carpal tunnel syndrome, right upper limb: Secondary | ICD-10-CM | POA: Diagnosis not present

## 2020-04-25 DIAGNOSIS — M720 Palmar fascial fibromatosis [Dupuytren]: Secondary | ICD-10-CM | POA: Diagnosis not present

## 2020-04-25 NOTE — Assessment & Plan Note (Signed)
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 He has been out of work and his symptoms have not surprisingly improved, we are holding off on Hydro dissection/injection for now.

## 2020-04-25 NOTE — Assessment & Plan Note (Signed)
Bernette Redbird returns, he does still have what appears to be Dupuytren's contracture, certainly there could be some bowstringing of his flexor tendons, I would like hand surgery to weigh in here. He is able to fully place his hand flat on the table however he feels as though this interferes with his work. We will keep him out of work for now.

## 2020-04-25 NOTE — Progress Notes (Signed)
    Procedures performed today:    None.  Independent interpretation of notes and tests performed by another provider:   None.  Brief History, Exam, Impression, and Recommendations:    Dupuytren's disease of palm of right hand Jacob Mcmillan returns, he does still have what appears to be Dupuytren's contracture, certainly there could be some bowstringing of his flexor tendons, I would like hand surgery to weigh in here. He is able to fully place his hand flat on the table however he feels as though this interferes with his work. We will keep him out of work for now.  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 He has been out of work and his symptoms have not surprisingly improved, we are holding off on Hydro dissection/injection for now.    ___________________________________________ Jacob Mcmillan. Benjamin Stain, M.D., ABFM., CAQSM. Primary Care and Sports Medicine [facility]  Adjunct Instructor of Family Medicine  [employer]

## 2020-05-01 ENCOUNTER — Ambulatory Visit: Payer: 59 | Admitting: Sports Medicine

## 2020-05-21 ENCOUNTER — Ambulatory Visit: Payer: 59 | Admitting: Osteopathic Medicine

## 2020-07-02 ENCOUNTER — Other Ambulatory Visit: Payer: Self-pay | Admitting: Osteopathic Medicine

## 2020-07-02 DIAGNOSIS — N529 Male erectile dysfunction, unspecified: Secondary | ICD-10-CM

## 2020-07-02 DIAGNOSIS — F411 Generalized anxiety disorder: Secondary | ICD-10-CM

## 2020-07-03 ENCOUNTER — Telehealth: Payer: Self-pay

## 2020-07-03 NOTE — Telephone Encounter (Signed)
H/T pharmacy requesting med refills for ibuprofen 800 mg. Rx not listed in active med list.

## 2020-07-04 MED ORDER — IBUPROFEN 800 MG PO TABS: 800.0000 mg | ORAL_TABLET | Freq: Three times a day (TID) | ORAL | 2 refills | Status: DC | PRN

## 2020-09-29 ENCOUNTER — Other Ambulatory Visit: Payer: Self-pay | Admitting: Osteopathic Medicine

## 2020-09-29 DIAGNOSIS — F411 Generalized anxiety disorder: Secondary | ICD-10-CM

## 2020-09-29 DIAGNOSIS — N529 Male erectile dysfunction, unspecified: Secondary | ICD-10-CM

## 2021-01-27 ENCOUNTER — Other Ambulatory Visit: Payer: Self-pay | Admitting: Osteopathic Medicine

## 2021-01-27 DIAGNOSIS — N529 Male erectile dysfunction, unspecified: Secondary | ICD-10-CM

## 2021-01-27 DIAGNOSIS — F411 Generalized anxiety disorder: Secondary | ICD-10-CM

## 2021-01-27 NOTE — Telephone Encounter (Signed)
Routing to covering

## 2021-09-09 ENCOUNTER — Other Ambulatory Visit: Payer: Self-pay | Admitting: Osteopathic Medicine

## 2022-01-28 ENCOUNTER — Encounter: Payer: Self-pay | Admitting: Sports Medicine

## 2022-01-28 ENCOUNTER — Telehealth: Payer: Self-pay | Admitting: Sports Medicine

## 2022-01-28 ENCOUNTER — Ambulatory Visit (INDEPENDENT_AMBULATORY_CARE_PROVIDER_SITE_OTHER): Payer: 59 | Admitting: Sports Medicine

## 2022-01-28 ENCOUNTER — Ambulatory Visit (INDEPENDENT_AMBULATORY_CARE_PROVIDER_SITE_OTHER): Payer: 59

## 2022-01-28 DIAGNOSIS — M25531 Pain in right wrist: Secondary | ICD-10-CM

## 2022-01-28 DIAGNOSIS — G8929 Other chronic pain: Secondary | ICD-10-CM | POA: Diagnosis not present

## 2022-01-28 MED ORDER — MELOXICAM 15 MG PO TABS: ORAL_TABLET | ORAL | 3 refills | Status: DC

## 2022-01-28 NOTE — Progress Notes (Signed)
    Procedures performed today:    None.  Independent interpretation of notes and tests performed by another provider:   None.  Brief History, Exam, Impression, and Recommendations:    Chronic pain of right wrist Pleasant 56 year old male, 3 years on and off pain and swelling right wrist, he has had several visits with other providers including [facility]. On exam he has pain and swelling referable to the radiocarpal joint. He has had greater than 6 weeks of physician directed conservative treatment, proceeding with x-rays and MRI of the right wrist. Adding meloxicam for pain relief in the meantime. Out of work for 3 weeks per his request. Return to see me to go over MRI results together.  Chronic process with exacerbation and pharmacologic intervention  ____________________________________________ Ihor Austin. Benjamin Stain, M.D., ABFM., CAQSM., AME. Primary Care and Sports Medicine [facility] [facility]  Adjunct Professor of Family Medicine  [employer]  Restaurant manager, fast food

## 2022-01-28 NOTE — Telephone Encounter (Signed)
Patient seen this morning, states he didn't receive an rx for Wellbutrin or cy alis would like it sent to  Pharmacy [facility] [address].

## 2022-01-28 NOTE — Telephone Encounter (Signed)
PCP would need to send these, I'm his sports medicine provider.

## 2022-01-28 NOTE — Assessment & Plan Note (Signed)
Pleasant 56 year old male, 3 years on and off pain and swelling right wrist, he has had several visits with other providers including [facility]. On exam he has pain and swelling referable to the radiocarpal joint. He has had greater than 6 weeks of physician directed conservative treatment, proceeding with x-rays and MRI of the right wrist. Adding meloxicam for pain relief in the meantime. Out of work for 3 weeks per his request. Return to see me to go over MRI results together.

## 2022-01-29 NOTE — Telephone Encounter (Signed)
He needs to schedule an appt to establish care for refills. Has not done that since Dr. Lyn Hollingshead left. Thanks.

## 2022-01-29 NOTE — Telephone Encounter (Signed)
Patient is scheduled for an appointment with Ander Slade

## 2022-01-29 NOTE — Telephone Encounter (Signed)
Patient is already scheduled for TOC to [facility] and a physical on 02/05/2022 @ 3:20. tvt

## 2022-01-30 NOTE — Telephone Encounter (Signed)
Pt aware that he needs to be seen prior to refills.

## 2022-02-05 ENCOUNTER — Encounter: Payer: Self-pay | Admitting: Medical-Surgical

## 2022-02-05 ENCOUNTER — Ambulatory Visit (INDEPENDENT_AMBULATORY_CARE_PROVIDER_SITE_OTHER): Payer: 59 | Admitting: Medical-Surgical

## 2022-02-05 VITALS — BP 156/96 | HR 86 | Resp 20 | Ht 68.0 in | Wt 196.5 lb

## 2022-02-05 DIAGNOSIS — F411 Generalized anxiety disorder: Secondary | ICD-10-CM

## 2022-02-05 DIAGNOSIS — N529 Male erectile dysfunction, unspecified: Secondary | ICD-10-CM | POA: Diagnosis not present

## 2022-02-05 DIAGNOSIS — Z1329 Encounter for screening for other suspected endocrine disorder: Secondary | ICD-10-CM

## 2022-02-05 DIAGNOSIS — Z125 Encounter for screening for malignant neoplasm of prostate: Secondary | ICD-10-CM

## 2022-02-05 DIAGNOSIS — R030 Elevated blood-pressure reading, without diagnosis of hypertension: Secondary | ICD-10-CM

## 2022-02-05 DIAGNOSIS — Z0000 Encounter for general adult medical examination without abnormal findings: Secondary | ICD-10-CM

## 2022-02-05 DIAGNOSIS — Z131 Encounter for screening for diabetes mellitus: Secondary | ICD-10-CM | POA: Diagnosis not present

## 2022-02-05 DIAGNOSIS — Z13220 Encounter for screening for lipoid disorders: Secondary | ICD-10-CM

## 2022-02-05 MED ORDER — BUPROPION HCL ER (XL) 150 MG PO TB24: 150.0000 mg | ORAL_TABLET | ORAL | 1 refills | Status: DC

## 2022-02-05 MED ORDER — TADALAFIL 10 MG PO TABS: 10.0000 mg | ORAL_TABLET | Freq: Every day | ORAL | 1 refills | Status: DC

## 2022-02-05 MED ORDER — ALPRAZOLAM 0.5 MG PO TABS: 0.2500 mg | ORAL_TABLET | Freq: Every day | ORAL | 0 refills | Status: DC | PRN

## 2022-02-05 NOTE — Progress Notes (Signed)
Complete physical exam  Patient: Jacob Mcmillan   DOB: [birth year]   56 y.o. Male  MRN: [id number]  Subjective:    Chief Complaint  Patient presents with   Transitions Of Care   Annual Exam   Hypertension    Jacob Mcmillan is a 56 y.o. male who presents today for a complete physical exam. He reports consuming a general diet. The patient has a physically strenuous job, but has no regular exercise apart from work.  He generally feels well. He reports sleeping fairly well. He does not have additional problems to discuss today.    Most recent fall risk assessment:    10/18/2019    4:02 PM  Fall Risk   Falls in the past year? 0  Number falls in past yr: 0  Injury with Fall? 0     Most recent depression screenings:    02/05/2022    3:22 PM 10/18/2019    4:02 PM  PHQ 2/9 Scores  PHQ - 2 Score 0 1  PHQ- 9 Score  3    Vision:Within last year, Dental: No current dental problems and Receives regular dental care, STD: The patient denies history of sexually transmitted disease., and PSA: Prostate cancer screening and PSA options (with potential risks and benefits of testing vs. not testing) were discussed along with recent recs/guidelines.     Patient Care Team: Christen Butter, NP as PCP - General (Nurse Practitioner) Monica Becton, MD as Consulting Physician (Sports Medicine) Bobbitt, Heywood Iles, MD as Consulting Physician (Allergy and Immunology)   Outpatient Medications Prior to Visit  Medication Sig   [DISCONTINUED] ALPRAZolam Prudy Feeler) 0.5 MG tablet Take 0.5-1 tablets (0.25-0.5 mg total) by mouth daily as needed for anxiety. This is a 30 day supply. NEEDS TO ESTABLISH WITH A NEW PCP FOR FURTHER REFILLS   [DISCONTINUED] buPROPion (WELLBUTRIN XL) 150 MG 24 hr tablet Take 1 tablet (150 mg total) by mouth every morning.   [DISCONTINUED] predniSONE (DELTASONE) 50 MG tablet One tab PO daily for 5 days.   [DISCONTINUED] tadalafil (CIALIS) 10 MG tablet Take 1 tablet (10  mg total) by mouth daily. NEEDS TO ESTABLISH WITH NEW PCP FOR FURTHER REFILLS   [DISCONTINUED] chlorhexidine (PERIDEX) 0.12 % solution Use as directed 15 mLs in the mouth or throat 2 (two) times daily. Use up to 4 times daily if dental pain / gum pain   [DISCONTINUED] clindamycin (CLEOCIN) 300 MG capsule Take 1 capsule (300 mg total) by mouth 3 (three) times daily.   [DISCONTINUED] gabapentin (NEURONTIN) 300 MG capsule One tab PO qHS   [DISCONTINUED] meloxicam (MOBIC) 15 MG tablet One tab PO every 24 hours with a meal for 2 weeks, then once every 24 hours prn pain.   No facility-administered medications prior to visit.   Review of Systems  Constitutional:  Negative for chills, fever, malaise/fatigue and weight loss.  HENT:  Negative for congestion, ear pain, hearing loss, sinus pain and sore throat.   Eyes:  Negative for blurred vision, photophobia and pain.  Respiratory:  Negative for cough, shortness of breath and wheezing.   Cardiovascular:  Negative for chest pain, palpitations and leg swelling.  Gastrointestinal:  Negative for abdominal pain, constipation, diarrhea, heartburn, nausea and vomiting.  Genitourinary:  Negative for dysuria, frequency and urgency.  Musculoskeletal:  Negative for falls and neck pain.  Skin:  Negative for itching and rash.  Neurological:  Negative for dizziness, weakness and headaches.  Endo/Heme/Allergies:  Negative for polydipsia. Does not bruise/bleed  easily.  Psychiatric/Behavioral:  Negative for depression, substance abuse and suicidal ideas. The patient is nervous/anxious.      Objective:    BP (!) 156/96 (BP Location: Left Arm, Cuff Size: Normal)   Pulse 86   Resp 20   Ht 5\' 8"  (1.727 m)   Wt 196 lb 8 oz (89.1 kg)   SpO2 97%   BMI 29.88 kg/m    Physical Exam Constitutional:      General: He is not in acute distress.    Appearance: Normal appearance. He is not ill-appearing.  HENT:     Head: Normocephalic and atraumatic.     Right Ear:  Tympanic membrane, ear canal and external ear normal. There is no impacted cerumen.     Left Ear: Tympanic membrane, ear canal and external ear normal. There is no impacted cerumen.     Nose: Nose normal. No congestion or rhinorrhea.     Mouth/Throat:     Mouth: Mucous membranes are moist.     Pharynx: No oropharyngeal exudate or posterior oropharyngeal erythema.  Eyes:     General: No scleral icterus.       Right eye: No discharge.        Left eye: No discharge.     Extraocular Movements: Extraocular movements intact.     Conjunctiva/sclera: Conjunctivae normal.     Pupils: Pupils are equal, round, and reactive to light.  Neck:     Thyroid: No thyromegaly.     Vascular: No carotid bruit or JVD.     Trachea: Trachea normal.  Cardiovascular:     Rate and Rhythm: Normal rate and regular rhythm.     Pulses: Normal pulses.     Heart sounds: Normal heart sounds. No murmur heard.    No friction rub. No gallop.  Pulmonary:     Effort: Pulmonary effort is normal. No respiratory distress.     Breath sounds: Normal breath sounds. No wheezing.  Abdominal:     General: Bowel sounds are normal. There is no distension.     Palpations: Abdomen is soft.     Tenderness: There is no abdominal tenderness. There is no guarding.  Musculoskeletal:        General: Normal range of motion.     Cervical back: Normal range of motion and neck supple.  Skin:    General: Skin is warm and dry.  Neurological:     Mental Status: He is alert and oriented to person, place, and time.     Cranial Nerves: No cranial nerve deficit.  Psychiatric:        Mood and Affect: Mood normal.        Behavior: Behavior normal.        Thought Content: Thought content normal.        Judgment: Judgment normal.   No results found for any visits on 02/05/22.     Assessment & Plan:    Routine Health Maintenance and Physical Exam  Immunization History  Administered Date(s) Administered   Tdap 04/08/2016    Health  Maintenance  Topic Date Due   Fecal DNA (Cologuard)  Never done   Zoster Vaccines- Shingrix (1 of 2) Never done   COVID-19 Vaccine (1) 02/21/2022 (Originally 08/26/1965)   INFLUENZA VACCINE  05/24/2022 (Originally 09/23/2021)   DTaP/Tdap/Td (2 - Td or Tdap) 04/08/2026   Hepatitis C Screening  Completed   HIV Screening  Completed   HPV VACCINES  Aged Out    Discussed health benefits of  physical activity, and encouraged him to engage in regular exercise appropriate for his age and condition.  1. GAD (generalized anxiety disorder) Continue Wellbutrin 150 mg daily.  Refilling Xanax for extremely sparing use. - ALPRAZolam (XANAX) 0.5 MG tablet; Take 0.5-1 tablets (0.25-0.5 mg total) by mouth daily as needed for anxiety.  Dispense: 10 tablet; Refill: 0 - buPROPion (WELLBUTRIN XL) 150 MG 24 hr tablet; Take 1 tablet (150 mg total) by mouth every morning.  Dispense: 90 tablet; Refill: 1  2. Erectile dysfunction, unspecified erectile dysfunction type Resume tadalafil 10 mg daily as needed. - tadalafil (CIALIS) 10 MG tablet; Take 1 tablet (10 mg total) by mouth daily.  Dispense: 90 tablet; Refill: 1  3. Prostate cancer screening Checking PSA. - PSA  4. Diabetes mellitus screening Checking hemoglobin A1c. - Hemoglobin A1c  5. Lipid screening Checking lipid panel. - Lipid panel  6. Thyroid disorder screen Checking TSH. - TSH  7. Annual physical exam Checking labs as below.  Up-to-date on preventative care.  Wellness information provided with AVS. - Lipid panel - COMPLETE METABOLIC PANEL WITH GFR - CBC with Differential/Platelet  8. White coat syndrome without diagnosis of hypertension Blood pressure elevated on arrival at 153/96, recheck 156/96.  Patient is not regularly checking blood pressure at home.  Upon recheck of blood pressure, patient advised MA that he will monitor his blood pressure at home and update [place] but he is not going to take any blood pressure medications.  Return in  about 1 year (around 02/06/2023) for annual physical exam or sooner if needed.   Christen Butter, NP

## 2022-02-06 LAB — HEMOGLOBIN A1C
Hgb A1c MFr Bld: 5.7 % of total Hgb — ABNORMAL HIGH (ref <5.7)
Mean Plasma Glucose: 117 mg/dL
eAG (mmol/L): 6.5 mmol/L

## 2022-02-06 LAB — CBC WITH DIFFERENTIAL/PLATELET
Absolute Monocytes: 918 cells/uL (ref 200–950)
Basophils Absolute: 62 cells/uL (ref 0–200)
Basophils Relative: 0.9 %
Eosinophils Absolute: 269 cells/uL (ref 15–500)
Eosinophils Relative: 3.9 %
HCT: 47.1 % (ref 38.5–50.0)
Hemoglobin: 16.2 g/dL (ref 13.2–17.1)
Lymphs Abs: 2063 cells/uL (ref 850–3900)
MCH: 29.8 pg (ref 27.0–33.0)
MCHC: 34.4 g/dL (ref 32.0–36.0)
MCV: 86.6 fL (ref 80.0–100.0)
MPV: 9.7 fL (ref 7.5–12.5)
Monocytes Relative: 13.3 %
Neutro Abs: 3588 cells/uL (ref 1500–7800)
Neutrophils Relative %: 52 %
Platelets: 199 10*3/uL (ref 140–400)
RBC: 5.44 10*6/uL (ref 4.20–5.80)
RDW: 12.8 % (ref 11.0–15.0)
Total Lymphocyte: 29.9 %
WBC: 6.9 10*3/uL (ref 3.8–10.8)

## 2022-02-06 LAB — COMPLETE METABOLIC PANEL WITH GFR
AG Ratio: 2.2 (calc) (ref 1.0–2.5)
ALT: 37 U/L (ref 9–46)
AST: 26 U/L (ref 10–35)
Albumin: 4.8 g/dL (ref 3.6–5.1)
Alkaline phosphatase (APISO): 70 U/L (ref 35–144)
BUN: 16 mg/dL (ref 7–25)
CO2: 28 mmol/L (ref 20–32)
Calcium: 9.6 mg/dL (ref 8.6–10.3)
Chloride: 103 mmol/L (ref 98–110)
Creat: 1.13 mg/dL (ref 0.70–1.30)
Globulin: 2.2 g/dL (calc) (ref 1.9–3.7)
Glucose, Bld: 88 mg/dL (ref 65–139)
Potassium: 4.1 mmol/L (ref 3.5–5.3)
Sodium: 141 mmol/L (ref 135–146)
Total Bilirubin: 0.6 mg/dL (ref 0.2–1.2)
Total Protein: 7 g/dL (ref 6.1–8.1)
eGFR: 77 mL/min/{1.73_m2} (ref >=60)

## 2022-02-06 LAB — LIPID PANEL
Cholesterol: 166 mg/dL (ref <200)
HDL: 37 mg/dL — ABNORMAL LOW (ref >=40)
LDL Cholesterol (Calc): 90 mg/dL (calc)
Non-HDL Cholesterol (Calc): 129 mg/dL (calc) (ref <130)
Total CHOL/HDL Ratio: 4.5 (calc) (ref <5.0)
Triglycerides: 322 mg/dL — ABNORMAL HIGH (ref <150)

## 2022-02-06 LAB — TSH: TSH: 1.63 mIU/L (ref 0.40–4.50)

## 2022-02-06 LAB — PSA: PSA: 1.44 ng/mL (ref <=4.00)

## 2022-02-07 ENCOUNTER — Ambulatory Visit (INDEPENDENT_AMBULATORY_CARE_PROVIDER_SITE_OTHER): Payer: 59

## 2022-02-07 DIAGNOSIS — G8929 Other chronic pain: Secondary | ICD-10-CM | POA: Diagnosis not present

## 2022-02-07 DIAGNOSIS — M25531 Pain in right wrist: Secondary | ICD-10-CM | POA: Diagnosis not present

## 2022-02-09 NOTE — Progress Notes (Signed)
Pt informed of the message about labs. Pt states he will work on diet in order to try and improve numbers.  Will contact office if he decides to start medication. Adamariz Gillott Zimmerman Rumple, CMA

## 2022-11-27 ENCOUNTER — Other Ambulatory Visit: Payer: Self-pay | Admitting: Medical-Surgical

## 2022-11-27 DIAGNOSIS — Z1212 Encounter for screening for malignant neoplasm of rectum: Secondary | ICD-10-CM

## 2022-11-27 DIAGNOSIS — Z1211 Encounter for screening for malignant neoplasm of colon: Secondary | ICD-10-CM

## 2023-06-03 ENCOUNTER — Encounter: Payer: Self-pay | Admitting: Medical-Surgical

## 2023-06-03 ENCOUNTER — Ambulatory Visit (INDEPENDENT_AMBULATORY_CARE_PROVIDER_SITE_OTHER): Admitting: Medical-Surgical

## 2023-06-03 VITALS — BP 158/97 | HR 83 | Resp 20 | Ht 68.0 in | Wt 191.1 lb

## 2023-06-03 DIAGNOSIS — F411 Generalized anxiety disorder: Secondary | ICD-10-CM

## 2023-06-03 DIAGNOSIS — N529 Male erectile dysfunction, unspecified: Secondary | ICD-10-CM | POA: Diagnosis not present

## 2023-06-03 DIAGNOSIS — R030 Elevated blood-pressure reading, without diagnosis of hypertension: Secondary | ICD-10-CM

## 2023-06-03 MED ORDER — TADALAFIL 20 MG PO TABS: 20.0000 mg | ORAL_TABLET | Freq: Every day | ORAL | 11 refills | Status: AC

## 2023-06-03 MED ORDER — BUPROPION HCL 75 MG PO TABS: 75.0000 mg | ORAL_TABLET | Freq: Two times a day (BID) | ORAL | 1 refills | Status: AC

## 2023-06-03 MED ORDER — PROPRANOLOL HCL 10 MG PO TABS: 10.0000 mg | ORAL_TABLET | Freq: Three times a day (TID) | ORAL | 1 refills | Status: AC | PRN

## 2023-06-03 MED ORDER — ALPRAZOLAM 0.5 MG PO TABS: 0.2500 mg | ORAL_TABLET | Freq: Every day | ORAL | 0 refills | Status: AC | PRN

## 2023-06-03 NOTE — Progress Notes (Signed)
 Established patient visit  History, exam, impression, and plan:  1. GAD (generalized anxiety disorder) (Primary) Pleasant 58 year old male presenting today with a history of generalized anxiety disorder.  Previously treated with Wellbutrin XL 150 mg daily however this seemed to increase his blood pressure and heart rate so he self discontinued.  History of being treated with very small quantities of Xanax 0.25-0.5 mg daily as needed.  Aware to use this very sparingly and only for severe anxiety that does not respond to other measures.  Today reports that he continues to struggle with anxiety that can be quite difficult to manage at times.  Interested in options available.  Denies any concerns with depression but is concerned with his level of anxiety and the effects on his already severe ADHD.  After discussion, we will try switching to bupropion 75 mg twice daily rather than the extended length version.  I will refill Xanax for 10 tablets which has historically lasted him for greater than 1 year.  Although I feel he would benefit from an SSRI, we will go with an as needed medication for now.  With his elevated blood pressure and elevated heart rate in the setting of severe anxiety, adding propranolol 10-20 mg 3 times daily as needed.  If this is beneficial, we may be able to switch this over to extended release for a full 24-hour coverage with minimal dosing requirements. - ALPRAZolam (XANAX) 0.5 MG tablet; Take 0.5-1 tablets (0.25-0.5 mg total) by mouth daily as needed for anxiety.  Dispense: 10 tablet; Refill: 0 - buPROPion (WELLBUTRIN) 75 MG tablet; Take 1 tablet (75 mg total) by mouth 2 (two) times daily.  Dispense: 60 tablet; Refill: 1 - propranolol (INDERAL) 10 MG tablet; Take 1-2 tablets (10-20 mg total) by mouth 3 (three) times daily as needed.  Dispense: 120 tablet; Refill: 1  2. White coat syndrome without diagnosis of hypertension Long history of whitecoat hypertension.  He checks  blood pressure at home periodically and notes that his readings are most often 130/80 or less.  Works to follow a low-sodium diet and exercises regularly.  Takes plenty of fluids to stay hydrated and denies any concerning symptoms.  Today's blood pressure is elevated at 158/97.  As noted above, feel that propranolol will be a good option for him if this is well-tolerated since able manage pulse, hypertension, and anxiety. - propranolol (INDERAL) 10 MG tablet; Take 1-2 tablets (10-20 mg total) by mouth 3 (three) times daily as needed.  Dispense: 120 tablet; Refill: 1  3. Erectile dysfunction, unspecified erectile dysfunction type History of erectile dysfunction that has responded favorably to tadalafil.  Previously taking 10 mg daily on an as-needed basis.  Reports that he uses a lot but tries to avoid using it multiple days in a row or for longer periods of time.  Is interested in maybe increasing his dose.  Reports the 10 works okay but he feels that the medication could work little better.  Sending in tadalafil 20 mg daily as needed.  Okay to use 1/2 tablet to 1 full tablet daily recommend to avoid using more than 1 full tablet per day. - tadalafil (CIALIS) 20 MG tablet; Take 1 tablet (20 mg total) by mouth daily.  Dispense: 30 tablet; Refill: 11  Procedures performed this visit: None.  Return in about 6 weeks (around 07/15/2023) for mood follow up.  __________________________________ Thayer Ohm, DNP, APRN, FNP-BC Primary Care and Sports Medicine [facility]

## 2023-07-15 ENCOUNTER — Ambulatory Visit: Admitting: Medical-Surgical

## 2023-07-15 NOTE — Progress Notes (Deleted)
   Established patient visit  History, exam, impression, and plan:  No problem-specific Assessment & Plan notes found for this encounter.   ROS  Physical Exam  Procedures performed this visit: None.  No follow-ups on file.  __________________________________ Thayer Ohm, DNP, APRN, FNP-BC Primary Care and Sports Medicine [facility]

## 2023-07-23 ENCOUNTER — Ambulatory Visit: Admitting: Medical-Surgical

## 2023-07-23 NOTE — Progress Notes (Deleted)
   Established patient visit  History, exam, impression, and plan:  No problem-specific Assessment & Plan notes found for this encounter.   ROS  Physical Exam  Procedures performed this visit: None.  No follow-ups on file.  __________________________________ Thayer Ohm, DNP, APRN, FNP-BC Primary Care and Sports Medicine [facility]

## 2023-10-28 ENCOUNTER — Encounter: Payer: Self-pay | Admitting: Sports Medicine

## 2023-12-09 ENCOUNTER — Telehealth: Payer: Self-pay

## 2023-12-09 NOTE — Telephone Encounter (Signed)
 Left message for a return call for colon cancer screening. No documentation for a colonoscopy or Cologuard.
